# Patient Record
Sex: Female | Born: 1970 | Race: White | Hispanic: No | Marital: Married | Smoking: Never smoker
Health system: Southern US, Community
[De-identification: ages and names within clinical notes are randomized; demographics above are authoritative.]

## PROBLEM LIST (undated history)

## (undated) DIAGNOSIS — R0602 Shortness of breath: Secondary | ICD-10-CM

## (undated) HISTORY — PX: IRRIGATION AND DEBRIDEMENT HEMATOMA: SHX5254

## (undated) HISTORY — PX: COSMETIC SURGERY: SHX468

## (undated) HISTORY — PX: EYE SURGERY: SHX253

## (undated) HISTORY — DX: Shortness of breath: R06.02

---

## 1998-04-19 ENCOUNTER — Other Ambulatory Visit: Admission: RE | Admit: 1998-04-19 | Discharge: 1998-04-19 | Payer: Self-pay | Admitting: Obstetrics and Gynecology

## 1999-01-03 ENCOUNTER — Emergency Department (HOSPITAL_COMMUNITY): Admission: EM | Admit: 1999-01-03 | Discharge: 1999-01-04 | Payer: Self-pay

## 1999-07-25 ENCOUNTER — Other Ambulatory Visit: Admission: RE | Admit: 1999-07-25 | Discharge: 1999-07-25 | Payer: Self-pay | Admitting: Obstetrics and Gynecology

## 1999-08-08 HISTORY — PX: MANDIBLE SURGERY: SHX707

## 1999-10-03 ENCOUNTER — Encounter: Payer: Self-pay | Admitting: Oral and Maxillofacial Surgery

## 1999-10-05 ENCOUNTER — Inpatient Hospital Stay (HOSPITAL_COMMUNITY): Admission: RE | Admit: 1999-10-05 | Discharge: 1999-10-06 | Payer: Self-pay | Admitting: Oral and Maxillofacial Surgery

## 2000-01-11 ENCOUNTER — Other Ambulatory Visit: Admission: RE | Admit: 2000-01-11 | Discharge: 2000-01-11 | Payer: Self-pay | Admitting: Obstetrics and Gynecology

## 2000-10-12 ENCOUNTER — Other Ambulatory Visit: Admission: RE | Admit: 2000-10-12 | Discharge: 2000-10-12 | Payer: Self-pay | Admitting: Obstetrics and Gynecology

## 2002-03-27 ENCOUNTER — Other Ambulatory Visit: Admission: RE | Admit: 2002-03-27 | Discharge: 2002-03-27 | Payer: Self-pay | Admitting: Obstetrics and Gynecology

## 2002-08-07 HISTORY — PX: OTHER SURGICAL HISTORY: SHX169

## 2003-06-22 ENCOUNTER — Other Ambulatory Visit: Admission: RE | Admit: 2003-06-22 | Discharge: 2003-06-22 | Payer: Self-pay | Admitting: Obstetrics and Gynecology

## 2004-08-16 ENCOUNTER — Other Ambulatory Visit: Admission: RE | Admit: 2004-08-16 | Discharge: 2004-08-16 | Payer: Self-pay | Admitting: Obstetrics and Gynecology

## 2005-03-13 ENCOUNTER — Other Ambulatory Visit: Admission: RE | Admit: 2005-03-13 | Discharge: 2005-03-13 | Payer: Self-pay | Admitting: Obstetrics and Gynecology

## 2006-02-19 ENCOUNTER — Encounter: Admission: RE | Admit: 2006-02-19 | Discharge: 2006-02-19 | Payer: Self-pay | Admitting: Orthopedic Surgery

## 2006-02-22 ENCOUNTER — Ambulatory Visit (HOSPITAL_COMMUNITY): Admission: RE | Admit: 2006-02-22 | Discharge: 2006-02-22 | Payer: Self-pay | Admitting: Orthopedic Surgery

## 2007-07-28 ENCOUNTER — Emergency Department (HOSPITAL_COMMUNITY): Admission: EM | Admit: 2007-07-28 | Discharge: 2007-07-28 | Payer: Self-pay | Admitting: Emergency Medicine

## 2007-08-05 ENCOUNTER — Encounter: Admission: RE | Admit: 2007-08-05 | Discharge: 2007-08-05 | Payer: Self-pay | Admitting: Surgery

## 2007-11-22 ENCOUNTER — Encounter (INDEPENDENT_AMBULATORY_CARE_PROVIDER_SITE_OTHER): Payer: Self-pay | Admitting: Surgery

## 2007-11-22 ENCOUNTER — Ambulatory Visit (HOSPITAL_BASED_OUTPATIENT_CLINIC_OR_DEPARTMENT_OTHER): Admission: RE | Admit: 2007-11-22 | Discharge: 2007-11-22 | Payer: Self-pay | Admitting: Surgery

## 2008-08-10 LAB — HM MAMMOGRAPHY: HM Mammogram: NORMAL

## 2009-03-15 ENCOUNTER — Encounter: Admission: RE | Admit: 2009-03-15 | Discharge: 2009-03-15 | Payer: Self-pay | Admitting: Surgery

## 2009-03-16 ENCOUNTER — Encounter: Admission: RE | Admit: 2009-03-16 | Discharge: 2009-03-16 | Payer: Self-pay | Admitting: Surgery

## 2009-07-14 ENCOUNTER — Encounter: Admission: RE | Admit: 2009-07-14 | Discharge: 2009-07-14 | Payer: Self-pay | Admitting: Obstetrics and Gynecology

## 2010-01-24 ENCOUNTER — Ambulatory Visit: Payer: Self-pay | Admitting: Family Medicine

## 2010-01-24 DIAGNOSIS — R0602 Shortness of breath: Secondary | ICD-10-CM | POA: Insufficient documentation

## 2010-01-24 HISTORY — DX: Shortness of breath: R06.02

## 2010-01-25 ENCOUNTER — Ambulatory Visit: Payer: Self-pay | Admitting: Family Medicine

## 2010-01-31 ENCOUNTER — Telehealth: Payer: Self-pay | Admitting: Family Medicine

## 2010-03-17 ENCOUNTER — Ambulatory Visit: Payer: Self-pay | Admitting: Family Medicine

## 2010-03-17 LAB — CONVERTED CEMR LAB
AST: 21 units/L (ref 0–37)
Albumin: 4.2 g/dL (ref 3.5–5.2)
BUN: 14 mg/dL (ref 6–23)
Basophils Absolute: 0 10*3/uL (ref 0.0–0.1)
Bilirubin Urine: NEGATIVE
CO2: 27 meq/L (ref 19–32)
Chloride: 106 meq/L (ref 96–112)
Cholesterol: 158 mg/dL (ref 0–200)
GFR calc non Af Amer: 97.87 mL/min (ref >60)
Glucose, Bld: 93 mg/dL (ref 70–99)
Glucose, Urine, Semiquant: NEGATIVE
HCT: 38 % (ref 36.0–46.0)
Hemoglobin: 13 g/dL (ref 12.0–15.0)
Lymphs Abs: 2 10*3/uL (ref 0.7–4.0)
MCHC: 34.1 g/dL (ref 30.0–36.0)
MCV: 86.6 fL (ref 78.0–100.0)
Monocytes Absolute: 0.7 10*3/uL (ref 0.1–1.0)
Monocytes Relative: 11.7 % (ref 3.0–12.0)
Neutro Abs: 3 10*3/uL (ref 1.4–7.7)
Platelets: 338 10*3/uL (ref 150.0–400.0)
Potassium: 4.4 meq/L (ref 3.5–5.1)
Protein, U semiquant: NEGATIVE
RDW: 13.4 % (ref 11.5–14.6)
Sodium: 141 meq/L (ref 135–145)
Specific Gravity, Urine: 1.02
TSH: 1.46 microintl units/mL (ref 0.35–5.50)
Total Bilirubin: 0.4 mg/dL (ref 0.3–1.2)
VLDL: 18.4 mg/dL (ref 0.0–40.0)
WBC Urine, dipstick: NEGATIVE
pH: 5

## 2010-03-24 ENCOUNTER — Ambulatory Visit: Payer: Self-pay | Admitting: Family Medicine

## 2010-07-15 ENCOUNTER — Encounter
Admission: RE | Admit: 2010-07-15 | Discharge: 2010-07-15 | Payer: Self-pay | Source: Home / Self Care | Attending: Obstetrics and Gynecology | Admitting: Obstetrics and Gynecology

## 2010-08-29 ENCOUNTER — Encounter: Payer: Self-pay | Admitting: Surgery

## 2010-09-04 LAB — CONVERTED CEMR LAB: Pap Smear: ABNORMAL

## 2010-09-08 NOTE — Assessment & Plan Note (Signed)
Summary: cpx//ccm   Vital Signs:  Patient profile:   40 year old female Menstrual status:  regular Height:      67.75 inches Weight:      168 pounds Temp:     98.2 degrees F oral BP sitting:   110 / 80  (left arm) Cuff size:   regular  Vitals Entered By: Sid Falcon LPN (March 24, 2010 9:02 AM) CC: CPX   History of Present Illness: Here for CPE.    No signif chronic medical problems. Pt sees GYN and DERM yearly. Exercises regularly.  Last tetanus unknown.  Clinical Review Panels:  Prevention   Last Mammogram:  normal (05/07/2009)   Last Pap Smear:  abnormal (05/07/2009)  Lipid Management   Cholesterol:  158 (03/17/2010)   LDL (bad choesterol):  81 (03/17/2010)   HDL (good cholesterol):  58.60 (03/17/2010)  CBC   WBC:  5.8 (03/17/2010)   RBC:  4.39 (03/17/2010)   Hgb:  13.0 (03/17/2010)   Hct:  38.0 (03/17/2010)   Platelets:  338.0 (03/17/2010)   MCV  86.6 (03/17/2010)   MCHC  34.1 (03/17/2010)   RDW  13.4 (03/17/2010)   PMN:  51.5 (03/17/2010)   Lymphs:  33.7 (03/17/2010)   Monos:  11.7 (03/17/2010)   Eosinophils:  2.4 (03/17/2010)   Basophil:  0.7 (03/17/2010)  Complete Metabolic Panel   Glucose:  93 (03/17/2010)   Sodium:  141 (03/17/2010)   Potassium:  4.4 (03/17/2010)   Chloride:  106 (03/17/2010)   CO2:  27 (03/17/2010)   BUN:  14 (03/17/2010)   Creatinine:  0.7 (03/17/2010)   Albumin:  4.2 (03/17/2010)   Total Protein:  6.7 (03/17/2010)   Calcium:  9.4 (03/17/2010)   Total Bili:  0.4 (03/17/2010)   Alk Phos:  61 (03/17/2010)   SGPT (ALT):  11 (03/17/2010)   SGOT (AST):  21 (03/17/2010)   Allergies (verified): No Known Drug Allergies  Past History:  Family History: Last updated: 03/24/2010 Family History of Alcoholism/Addiction, father Mother Type 2 DM  Social History: Last updated: 01/24/2010 Occupation:  Student Married Never Smoked Alcohol use-no Regular exercise-yes 2 pregnancies 2 live births  Risk Factors: Exercise:  yes (01/24/2010)  Risk Factors: Smoking Status: never (01/24/2010)  Past Medical History: no chronic problems  Past Surgical History: Caesarean section, 1993, 1997 Jaw surgery 2001 abdomnaplasty 2004 Abdominal wall ?infected hematoma surgery 2008  Family History: Family History of Alcoholism/Addiction, father Mother Type 2 DM  Review of Systems  The patient denies anorexia, fever, weight loss, weight gain, vision loss, decreased hearing, hoarseness, chest pain, syncope, dyspnea on exertion, peripheral edema, prolonged cough, headaches, hemoptysis, abdominal pain, melena, hematochezia, severe indigestion/heartburn, hematuria, incontinence, genital sores, muscle weakness, suspicious skin lesions, transient blindness, difficulty walking, depression, unusual weight change, abnormal bleeding, enlarged lymph nodes, and breast masses.    Physical Exam  General:  Well-developed,well-nourished,in no acute distress; alert,appropriate and cooperative throughout examination Head:  Normocephalic and atraumatic without obvious abnormalities. No apparent alopecia or balding. Eyes:  No corneal or conjunctival inflammation noted. EOMI. Perrla. Funduscopic exam benign, without hemorrhages, exudates or papilledema. Vision grossly normal. Ears:  External ear exam shows no significant lesions or deformities.  Otoscopic examination reveals clear canals, tympanic membranes are intact bilaterally without bulging, retraction, inflammation or discharge. Hearing is grossly normal bilaterally. Mouth:  Oral mucosa and oropharynx without lesions or exudates.  Teeth in good repair. Neck:  No deformities, masses, or tenderness noted. Breasts:  gyn Lungs:  Normal respiratory effort, chest expands symmetrically.  Lungs are clear to auscultation, no crackles or wheezes. Heart:  Normal rate and regular rhythm. S1 and S2 normal without gallop, murmur, click, rub or other extra sounds. Abdomen:  soft and non-tender.     Genitalia:  gyn Msk:  No deformity or scoliosis noted of thoracic or lumbar spine.   Extremities:  No clubbing, cyanosis, edema, or deformity noted with normal full range of motion of all joints.   Neurologic:  alert & oriented X3 and cranial nerves II-XII intact.   Skin:  no rashes and no suspicious lesions.   Cervical Nodes:  No lymphadenopathy noted Psych:  normally interactive, good eye contact, not anxious appearing, and not depressed appearing.     Impression & Recommendations:  Problem # 1:  Preventive Health Care (ICD-V70.0) Labs reviewed with pt and all excellent.  Tdap given. Cont regular exercise.  Complete Medication List: 1)  Womens Multivitamin Plus Tabs (Multiple vitamins-minerals) .... Once daily 2)  Fish Oil 1000 Mg Caps (Omega-3 fatty acids) .... Once daily 3)  Ventolin Hfa 108 (90 Base) Mcg/act Aers (Albuterol sulfate) .... 2 puffs 30 minutes prior to exercise  Other Orders: Tdap => 39yrs IM (14782) Admin 1st Vaccine (95621)  Patient Instructions: 1)  Contnue regular exercise. 2)  Please schedule a follow-up appointment in 1 year.  3)  Take calcium +vitamin D daily.     Immunizations Administered:  Tetanus Vaccine:    Vaccine Type: Tdap    Site: left deltoid    Mfr: GlaxoSmithKline    Dose: 0.5 ml    Route: IM    Given by: Sid Falcon LPN    Exp. Date: 05/26/2012    Lot #: [id number]    VIS given: 06/25/07 version given March 24, 2010.

## 2010-09-08 NOTE — Assessment & Plan Note (Signed)
Summary: NEW PT EST // RS   Vital Signs:  Patient profile:   40 year old female Menstrual status:  regular LMP:     01/11/2010 Height:      67.5 inches Weight:      169 pounds BMI:     26.17 O2 Sat:      97 % on Room air Temp:     98.4 degrees F oral Pulse rate:   72 / minute Pulse rhythm:   regular Resp:     12 per minute BP sitting:   134 / 84  (left arm) Cuff size:   regular  Vitals Entered By: Sid Falcon LPN (January 24, 2010 3:04 PM)  O2 Flow:  Room air CC: New to establish LMP (date): 01/11/2010     Menstrual Status regular Enter LMP: 01/11/2010 Last PAP Result abnormal   History of Present Illness: New patient to establish care. Past medical history reviewed. Basically unremarkable past history. Takes no medications. No known drug allergies.  Family history and social history reviewed and as recorded.  Patient has recent issue over several months now of some shortness of breath. This occurs sometimes at rest and sometimes with activity. She notices for example that she has mild dyspnea at onset of exercise and usually this actually improves with exercise. Has occasional tightness in her chest which also improves with further exercise. She denies any GERD symptoms, wheezing, cough, pleuritic pain, or hemoptysis. No fevers, chills, appetite, or weight changes. She is nonsmoker.  No hx of asthma.  No definite palpitations. Exercises regularly with fairly intensive exercise. No correlation with symptoms and intensive exercise. Drinks 5-6 cups of coffee per day. Father may have had coronary disease in his 53s otherwise no family history.  Preventive Screening-Counseling & Management  Alcohol-Tobacco     Smoking Status: never  Caffeine-Diet-Exercise     Does Patient Exercise: yes  Past History:  Family History: Last updated: 01/24/2010 Family History of Alcoholism/Addiction, father  Social History: Last updated: 01/24/2010 Occupation:  Student Married Never  Smoked Alcohol use-no Regular exercise-yes 2 pregnancies 2 live births  Risk Factors: Exercise: yes (01/24/2010)  Risk Factors: Smoking Status: never (01/24/2010)  Past Surgical History: Caesarean section, 1993, 1997 Jaw surgery 2001 abdomnaplasty 2004 Stomach surgery 2008 PMH-FH-SH reviewed for relevance  Family History: Family History of Alcoholism/Addiction, father  Social History: Occupation:  Consulting civil engineer Married Never Smoked Alcohol use-no Regular exercise-yes 2 pregnancies 2 live births Smoking Status:  never Occupation:  employed Does Patient Exercise:  yes  Review of Systems  The patient denies anorexia, fever, weight loss, weight gain, syncope, peripheral edema, prolonged cough, headaches, hemoptysis, abdominal pain, melena, hematochezia, severe indigestion/heartburn, hematuria, incontinence, and muscle weakness.    Physical Exam  General:  Well-developed,well-nourished,in no acute distress; alert,appropriate and cooperative throughout examination Head:  Normocephalic and atraumatic without obvious abnormalities. No apparent alopecia or balding. Mouth:  Oral mucosa and oropharynx without lesions or exudates.  Teeth in good repair. Neck:  No deformities, masses, or tenderness noted. Lungs:  Normal respiratory effort, chest expands symmetrically. Lungs are clear to auscultation, no crackles or wheezes. Heart:  Normal rate and regular rhythm. S1 and S2 normal without gallop, murmur, click, rub or other extra sounds. Extremities:  no edema Cervical Nodes:  No lymphadenopathy noted Psych:  normally interactive, good eye contact, not anxious appearing, and not depressed appearing.     Impression & Recommendations:  Problem # 1:  DYSPNEA (ICD-786.05) Assessment New Doubt cardiac pathology as symptoms improve with  duration of exercise.  EKG is NSR with no acute changes.  Check CXR.  Reduce caffeine. Orders: EKG w/ Interpretation (93000) T-2 View CXR  (71020TC)  Complete Medication List: 1)  Womens Multivitamin Plus Tabs (Multiple vitamins-minerals) .... Once daily 2)  Fish Oil 1000 Mg Caps (Omega-3 fatty acids) .... Once daily  Patient Instructions: 1)  Gradually reduce caffeine consumption 2)  Follow up immediately if you have any exercise induced chest pain or shortness of breath  Preventive Care Screening  Mammogram:    Date:  05/07/2009    Results:  normal   Pap Smear:    Date:  05/07/2009    Results:  abnormal

## 2010-09-08 NOTE — Progress Notes (Signed)
Summary: wheezing with exercise  Phone Note Call from Patient Call back at [facility] Phone [phone]   Summary of Call: Today with exercising got the wheezing, chest pain/tightness that she was able to get through.  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  [facility]  NKDA. Initial call taken by: Rudy Jew, RN,  January 31, 2010 9:09 AM  Follow-up for Phone Call        Ventolin inhaler 2 puffs 30 minutes prior to exercise.  Refill times three. Follow-up by: Evelena Peat MD,  January 31, 2010 12:46 PM  Additional Follow-up for Phone Call Additional follow up Details #1::        Phone Call Completed Additional Follow-up by: Rudy Jew, RN,  January 31, 2010 1:39 PM    New/Updated Medications: VENTOLIN HFA 108 (90 BASE) MCG/ACT AERS (ALBUTEROL SULFATE) 2 puffs 30 minutes prior to exercise Prescriptions: VENTOLIN HFA 108 (90 BASE) MCG/ACT AERS (ALBUTEROL SULFATE) 2 puffs 30 minutes prior to exercise  #1 x 3   Entered by:   Rudy Jew, RN   Authorized by:   Evelena Peat MD   Signed by:   Rudy Jew, RN on 01/31/2010   Method used:   Electronically to        [facility]* (retail)       [address] [place], [place]  [address]       Ph: [phone] or [phone]       Fax: [phone]   RxID:   [id number]

## 2010-10-03 ENCOUNTER — Other Ambulatory Visit: Payer: Self-pay | Admitting: *Deleted

## 2010-10-03 DIAGNOSIS — R062 Wheezing: Secondary | ICD-10-CM

## 2010-10-03 MED ORDER — ALBUTEROL SULFATE HFA 108 (90 BASE) MCG/ACT IN AERS: 2.0000 | INHALATION_SPRAY | Freq: Four times a day (QID) | RESPIRATORY_TRACT | Status: DC | PRN

## 2010-10-11 ENCOUNTER — Telehealth: Payer: Self-pay | Admitting: Family Medicine

## 2010-10-11 DIAGNOSIS — R062 Wheezing: Secondary | ICD-10-CM

## 2010-10-11 MED ORDER — ALBUTEROL SULFATE HFA 108 (90 BASE) MCG/ACT IN AERS: 2.0000 | INHALATION_SPRAY | Freq: Four times a day (QID) | RESPIRATORY_TRACT | Status: DC | PRN

## 2010-10-11 NOTE — Telephone Encounter (Signed)
Pt called and said that Karin Golden has faxed over numerous req for Ventolin for her Asthma. Have not rcvd a response. Pls call in asap to [facility] [phone]

## 2010-10-11 NOTE — Telephone Encounter (Signed)
Rx sent electronically again, pt informed

## 2010-12-20 NOTE — Op Note (Signed)
NAME:  Velez, Debbie                ACCOUNT NO.:  [id number]   MEDICAL RECORD NO.:  [id number]          PATIENT TYPE:  AMB   LOCATION:  DSC                          FACILITY:  [facility]   PHYSICIAN:  Wilmon Arms. Corliss Skains, M.D. DATE OF BIRTH:  06/16/1971   DATE OF PROCEDURE:  11/22/2007  DATE OF DISCHARGE:                               OPERATIVE REPORT   PREOPERATIVE DIAGNOSIS:  Chronic abdominal wound, left lower abdomen.   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   PROCEDURE:  Wound exploration with excision of suture granuloma, 1 cm.   SURGEON:  Wilmon Arms. Corliss Skains, MD   ANESTHESIA:  General.   INDICATIONS:  The patient is a 40 year old female with a history of an  abdominoplasty in 2004.  The patient has had the persistent area of firm  scar tissue on her left side just below the level of her umbilicus.  This has caused intermittent pulling and pain.  This has progressed to a  chronic wound with some purulent drainage.  She presents for exploration  of this wound.   DESCRIPTION OF PROCEDURE:  The patient was brought to the operating room  and placed in supine position on the operating room table.  After an  adequate level of general anesthesia was obtained, the patient's abdomen  was prepped with Betadine and draped in sterile fashion.  A time-out was  taken.  Ensured the proper patient and proper procedure.  We infiltrated  the area around the chronic wound with 0.25% Marcaine with epinephrine.  I made an elliptical incision excising the old granulation tissue.  Dissection was carried down in the subcutaneous tissues with a blade.  Cautery was used to excise some of the old granulation tissue.  A  Prolene suture was identified and grasped with hemostat.  We continued  dissecting down around the suture.  There was some old chronic necrotic  granulation tissue, which was removed.  We were able locate the knot of  the Prolene.  This was  cut and removed.  We debrided as much granulation  tissue as possible.  The bed of the wound was cauterized for hemostasis.  The wound was packed with saline-soaked half inch new gauze.  A dry  dressing was placed.  The patient was extubated and brought to the  recovery room in stable condition.  All sponge, instrument, and needle  counts were correct.      Wilmon Arms. Tsuei, M.D.  Electronically Signed     MKT/MEDQ  D:  11/22/2007  T:  11/22/2007  Job:  [id number]

## 2010-12-23 NOTE — Op Note (Signed)
NAME:  Debbie Velez, Debbie Velez                ACCOUNT NO.:  [id number]   MEDICAL RECORD NO.:  [id number]          PATIENT TYPE:  AMB   LOCATION:  SDS                          FACILITY:  [facility]   PHYSICIAN:  Dionne Ano. Gramig III, M.D.DATE OF BIRTH:  16-Sep-1970   DATE OF PROCEDURE:  DATE OF DISCHARGE:  02/22/2006                                 OPERATIVE REPORT   PREOPERATIVE DIAGNOSIS:  Comminuted intraarticular proximal interphalangeal  joint fracture, right ring finger.   POSTOPERATIVE DIAGNOSIS:  Comminuted intraarticular proximal interphalangeal  joint fracture, right ring finger.   PROCEDURE:  1.  External fixation with dynamic external fixator device, right ring      finger.  2.  Open reduction, internal fixation, comminuted complex intraarticular      middle phalanx fracture at the proximal interphalangeal joint, of the      right ring finger.  3.  Stress radiography.   SURGEON:  Dionne Ano. Amanda Pea, MD   ASSISTANT:  ___________________.   COMPLICATIONS:  None.   ANESTHESIA:  General.   TOURNIQUET TIME:  Less than an hour.   INDICATIONS FOR PROCEDURE:  Debbie Velez is a very pleasant female who is 40  years of age and presents with comminuted intraarticular PIP fracture about  her left ring finger.  She has deviatory deformity and a pilon type crush.  I have counseled her in regards risks and benefits of surgery and the above-  mentioned operative procedure.   OPERATIVE PROCEDURE IN DETAIL:  The patient was given anesthesia, taken to  the operating suite and had general anesthesia.  Once this was done, she was  lay supine, thoroughly padded and underwent surgical Betadine scrub and  paint upper extremity.  Once this was done, the arm was elevated.  Tourniquet was inflated to 250 mmHg, and an external fixer apparatus was  assembled.  A 0.045 K-wire was placed through the epicenter of the proximal  phalanx distal condyle under LAT fluoro.  Following this, a similar K-wire  was  placed about the middle phalanx distally.  These K-wires were then bent  just off the skin surface at 90 degrees.  (The proximal pin was bent 90  degrees.)  Following this, bent at 30-60 degrees, the proximal wire once  again just 8 mm distal to the distal pin.  Once this was done, I then  assembled the pins and bent them at right angles to allow for distraction of  the PIP joint.  They dynamic external fixator was placed without difficulty  utilizing this.  At this point in time, I was pleased with the distraction  and findings.  The traction fixation device looked excellent.  Once this was  done, I then turned attention towards the open reduction portion of the  articular area.  A small incision was made dorsally.  A 0.062 K-wire was  placed about the mid portion of the middle phalanx to open up a pilot hole  just dorsally in the cortex.  I then threaded a blunt-tipped 0.045 K-wire  and punched out the articular surface until the articular congruity was  restored  to my satisfaction without difficulty.  The articular congruity  looked excellent.  I manipulated this and performed the open reduction  without difficulty.  Once this was done, I then closed the interval, and  closed the skin with Prolene after tourniquet was deflated.  Once this was  done, final copy of x-rays were taken.  Full range of motion was noted on  the table with flexion and extension passively.  X-rays were saved for  followup, and I discuss these and showed these to the patient's family.  Once this was done, she was dressed with pin caps, followed by Xeroform.  Intermetacarpal block was placed with lidocaine 0.25% without epinephrine,  and the patient then underwent placement of a splint.  She tolerated the  procedure well.  She will be given an additional g of Ancef in the recovery  room and be discharged home on Percocet, Robaxin, Keflex.  Return to see me  in the office Monday at 2 p.m. to initiate therapeutic  endeavors.  All  questions have been encouraged and answered.           ______________________________  Dionne Ano. Everlene Other, M.D.     Luther Mantis  D:  02/22/2006  T:  02/23/2006  Job:  [id number]

## 2011-01-09 ENCOUNTER — Encounter: Payer: Self-pay | Admitting: Family Medicine

## 2011-01-09 ENCOUNTER — Ambulatory Visit (INDEPENDENT_AMBULATORY_CARE_PROVIDER_SITE_OTHER): Payer: Self-pay | Admitting: Family Medicine

## 2011-01-09 VITALS — BP 140/84 | Temp 98.0°F | Ht 67.5 in | Wt 170.0 lb

## 2011-01-09 DIAGNOSIS — R21 Rash and other nonspecific skin eruption: Secondary | ICD-10-CM

## 2011-01-09 NOTE — Progress Notes (Signed)
  Subjective:    Patient ID: Debbie Velez, female    DOB: 1971-03-12, 40 y.o.   MRN: [id number]  HPI Patient seen with question of spider bite left posterior thigh. She noticed last Thursday question of bite. Had itching since then and no pain. No pustular center. No fever or chills. This has not decreased in size. Has not tried anything topical other than hydrocortisone cream.  Has history of acne involving trunk.     Review of Systems  Constitutional: Negative for fever, chills and fatigue.  Hematological: Negative for adenopathy. Does not bruise/bleed easily.       Objective:   Physical Exam  Constitutional: She appears well-developed and well-nourished.  Cardiovascular: Normal rate, regular rhythm and normal heart sounds.   Pulmonary/Chest: Effort normal and breath sounds normal. No respiratory distress. She has no wheezes. She has no rales.  Musculoskeletal: She exhibits no edema.  Skin:       Patient has area of erythema slightly raised nontender non-warm to touch with no vesicles and no pustules left posterior thigh. The area of erythema is approximately 2 x 2 centimeters.          Assessment & Plan:  #1 local allergic rash probably related to bite reaction. Reassurance. Over-the-counter antihistamine #2 acne. Continue clindamycin lotion. Called pharmacy and they have some in stock

## 2011-01-09 NOTE — Patient Instructions (Signed)
Try over-the-counter antihistamine such as Allegra, Claritin, or Zyrtec for the itching. Area of redness should fade over the next week

## 2011-05-02 LAB — POCT HEMOGLOBIN-HEMACUE: Hemoglobin: 13.7

## 2011-05-12 LAB — URINALYSIS, ROUTINE W REFLEX MICROSCOPIC
Bilirubin Urine: NEGATIVE
Nitrite: NEGATIVE
Specific Gravity, Urine: 1.009
pH: 5.5

## 2011-05-12 LAB — URINE MICROSCOPIC-ADD ON

## 2011-05-12 LAB — POCT PREGNANCY, URINE: Operator id: 284251

## 2011-06-19 ENCOUNTER — Other Ambulatory Visit: Payer: Self-pay | Admitting: Obstetrics and Gynecology

## 2011-06-19 DIAGNOSIS — Z1231 Encounter for screening mammogram for malignant neoplasm of breast: Secondary | ICD-10-CM

## 2011-07-06 ENCOUNTER — Ambulatory Visit (INDEPENDENT_AMBULATORY_CARE_PROVIDER_SITE_OTHER): Payer: BC Managed Care – PPO | Admitting: Family Medicine

## 2011-07-06 ENCOUNTER — Encounter: Payer: Self-pay | Admitting: Family Medicine

## 2011-07-06 VITALS — BP 140/88 | Temp 98.6°F | Wt 168.0 lb

## 2011-07-06 DIAGNOSIS — R05 Cough: Secondary | ICD-10-CM

## 2011-07-06 MED ORDER — HYDROCODONE-HOMATROPINE 5-1.5 MG/5ML PO SYRP: 5.0000 mL | ORAL_SOLUTION | Freq: Four times a day (QID) | ORAL | Status: AC | PRN

## 2011-07-06 NOTE — Progress Notes (Addendum)
  Subjective:    Patient ID: Debbie Velez, female    DOB: 19-May-1971, 40 y.o.   MRN: [id number]  HPI Acute visit. Patient seen with five-day history of cough, nasal congestion, intermittent headaches, body aches, malaise, and chills. No definite fever. Taking Mucinex D and Zyrtec with some relief of congestion. Still coughing significantly at night. No wheezing. Husband with similar symptoms. Denies any nausea, vomiting, or diarrhea.   Review of Systems As per history of present illness    Objective:   Physical Exam  Constitutional: She appears well-developed and well-nourished. No distress.  HENT:  Right Ear: External ear normal.  Left Ear: External ear normal.       Previous tonsillectomy. Minimal posterior pharynx erythema. No exudate  Neck: Neck supple.  Cardiovascular: Normal rate and regular rhythm.   Pulmonary/Chest: Effort normal and breath sounds normal. No respiratory distress. She has no wheezes. She has no rales.  Lymphadenopathy:    She has no cervical adenopathy.  Skin: No rash noted.          Assessment & Plan:  Viral syndrome. Hycodan for nighttime cough. Followup promptly for fever or worsening symptoms  Spoke with patient.  Was feeling slightly better and now has increased facial pain, colored nasal discharge and some headaches.  No stiff neck .  Will send in rx for antibiotic with Z-max.  Pt has no drug allergies.

## 2011-07-06 NOTE — Patient Instructions (Signed)
Call for any fever or worsening symptoms.

## 2011-07-08 MED ORDER — AZITHROMYCIN 250 MG PO TABS: ORAL_TABLET | ORAL | Status: AC

## 2011-07-08 NOTE — Progress Notes (Signed)
Addended by: Kristian Covey on: 07/08/2011 10:28 AM   Modules accepted: Orders

## 2011-07-10 ENCOUNTER — Encounter: Payer: BC Managed Care – PPO | Admitting: Family Medicine

## 2011-07-20 ENCOUNTER — Other Ambulatory Visit: Payer: Self-pay | Admitting: Family Medicine

## 2011-07-20 NOTE — Telephone Encounter (Signed)
Last filled 07-06-2011

## 2011-07-20 NOTE — Telephone Encounter (Signed)
Refill once OK. 

## 2011-07-26 ENCOUNTER — Ambulatory Visit
Admission: RE | Admit: 2011-07-26 | Discharge: 2011-07-26 | Disposition: A | Discharge status: Home / Self Care | Payer: BC Managed Care – PPO | Source: Ambulatory Visit | Attending: Obstetrics and Gynecology | Admitting: Obstetrics and Gynecology

## 2011-07-26 DIAGNOSIS — Z1231 Encounter for screening mammogram for malignant neoplasm of breast: Secondary | ICD-10-CM

## 2011-09-27 ENCOUNTER — Other Ambulatory Visit: Payer: Self-pay | Admitting: Dermatology

## 2011-10-23 ENCOUNTER — Other Ambulatory Visit: Payer: Self-pay | Admitting: *Deleted

## 2012-06-19 ENCOUNTER — Other Ambulatory Visit: Payer: Self-pay | Admitting: Obstetrics and Gynecology

## 2012-06-19 DIAGNOSIS — Z1231 Encounter for screening mammogram for malignant neoplasm of breast: Secondary | ICD-10-CM

## 2012-08-08 ENCOUNTER — Ambulatory Visit: Payer: BC Managed Care – PPO

## 2012-09-06 ENCOUNTER — Ambulatory Visit: Payer: BC Managed Care – PPO | Admitting: Family Medicine

## 2012-09-25 ENCOUNTER — Ambulatory Visit
Admission: RE | Admit: 2012-09-25 | Discharge: 2012-09-25 | Disposition: A | Discharge status: Home / Self Care | Payer: BC Managed Care – PPO | Source: Ambulatory Visit | Attending: Obstetrics and Gynecology | Admitting: Obstetrics and Gynecology

## 2012-09-25 DIAGNOSIS — Z1231 Encounter for screening mammogram for malignant neoplasm of breast: Secondary | ICD-10-CM

## 2012-09-26 ENCOUNTER — Other Ambulatory Visit: Payer: Self-pay | Admitting: Obstetrics and Gynecology

## 2012-09-30 ENCOUNTER — Ambulatory Visit
Admission: RE | Admit: 2012-09-30 | Discharge: 2012-09-30 | Disposition: A | Discharge status: Home / Self Care | Payer: BC Managed Care – PPO | Source: Ambulatory Visit | Attending: Obstetrics and Gynecology | Admitting: Obstetrics and Gynecology

## 2012-09-30 DIAGNOSIS — R928 Other abnormal and inconclusive findings on diagnostic imaging of breast: Secondary | ICD-10-CM

## 2012-10-07 ENCOUNTER — Other Ambulatory Visit: Payer: BC Managed Care – PPO

## 2013-02-14 ENCOUNTER — Encounter: Payer: BC Managed Care – PPO | Admitting: Family Medicine

## 2013-03-03 ENCOUNTER — Encounter: Payer: Self-pay | Admitting: Family Medicine

## 2013-03-03 ENCOUNTER — Telehealth: Payer: Self-pay | Admitting: Family Medicine

## 2013-03-03 ENCOUNTER — Ambulatory Visit (INDEPENDENT_AMBULATORY_CARE_PROVIDER_SITE_OTHER): Payer: BC Managed Care – PPO | Admitting: Family Medicine

## 2013-03-03 VITALS — BP 126/78 | HR 68 | Temp 97.7°F | Ht 67.5 in | Wt 161.0 lb

## 2013-03-03 DIAGNOSIS — Z0000 Encounter for general adult medical examination without abnormal findings: Secondary | ICD-10-CM

## 2013-03-03 DIAGNOSIS — Z01419 Encounter for gynecological examination (general) (routine) without abnormal findings: Secondary | ICD-10-CM

## 2013-03-03 LAB — POCT URINALYSIS DIPSTICK
Glucose, UA: NEGATIVE
Nitrite, UA: NEGATIVE
Urobilinogen, UA: 0.2

## 2013-03-03 LAB — BASIC METABOLIC PANEL
BUN: 11 mg/dL (ref 6–23)
Calcium: 9.5 mg/dL (ref 8.4–10.5)
Creatinine, Ser: 0.7 mg/dL (ref 0.4–1.2)
GFR: 91.94 mL/min (ref >60.00)
Potassium: 4.5 mEq/L (ref 3.5–5.1)

## 2013-03-03 LAB — CBC WITH DIFFERENTIAL/PLATELET
Basophils Relative: 0.4 % (ref 0.0–3.0)
Eosinophils Absolute: 0.1 10*3/uL (ref 0.0–0.7)
Eosinophils Relative: 1.3 % (ref 0.0–5.0)
HCT: 43 % (ref 36.0–46.0)
Hemoglobin: 14.6 g/dL (ref 12.0–15.0)
MCHC: 34 g/dL (ref 30.0–36.0)
MCV: 89.2 fl (ref 78.0–100.0)
Monocytes Absolute: 0.7 10*3/uL (ref 0.1–1.0)
Neutro Abs: 4.2 10*3/uL (ref 1.4–7.7)
RBC: 4.82 Mil/uL (ref 3.87–5.11)

## 2013-03-03 LAB — LIPID PANEL: Cholesterol: 165 mg/dL (ref 0–200)

## 2013-03-03 LAB — HEPATIC FUNCTION PANEL
Albumin: 4.5 g/dL (ref 3.5–5.2)
Total Protein: 7.6 g/dL (ref 6.0–8.3)

## 2013-03-03 NOTE — Telephone Encounter (Signed)
Once labs come in, please MAIL a copy to patient, pt requested this upon her leaving from getting labs drawn. Thank you.

## 2013-03-03 NOTE — Progress Notes (Signed)
  Subjective:    Patient ID: Debbie Velez, female    DOB: 02/04/1971, 42 y.o.   MRN: [id number]  HPI Patient here for complete physical. She sees gynecologist dermatologist regularly. Generally very healthy with no chronic medical problems. She started consistent exercise program and dietary changes and has lost about 14-15 pounds over the past couple months. She feels great overall. Last tetanus 2011. She is getting mammograms and Pap smears through gynecologist. No indications for colonoscopy prior to 48  Past Medical History  Diagnosis Date  . DYSPNEA 01/24/2010   Past Surgical History  Procedure Laterality Date  . Cesarean section      1993, 1997  . Mandible surgery  2001  . Abdomnaplasty  2004  . Irrigation and debridement hematoma      infected abdominal wall surgery 2008    reports that she has never smoked. She does not have any smokeless tobacco history on file. Her alcohol and drug histories are not on file. family history includes Alcohol abuse in her father and sister and Diabetes in her mother. No Known Allergies    Review of Systems  Constitutional: Negative for fever, activity change, appetite change, fatigue and unexpected weight change.  HENT: Negative for hearing loss, ear pain, sore throat and trouble swallowing.   Eyes: Negative for visual disturbance.  Respiratory: Negative for cough and shortness of breath.   Cardiovascular: Negative for chest pain and palpitations.  Gastrointestinal: Negative for abdominal pain, diarrhea, constipation and blood in stool.  Genitourinary: Negative for dysuria and hematuria.  Musculoskeletal: Negative for myalgias, back pain and arthralgias.  Skin: Negative for rash.  Neurological: Negative for dizziness, syncope and headaches.  Hematological: Negative for adenopathy.  Psychiatric/Behavioral: Negative for confusion and dysphoric mood.       Objective:   Physical Exam  Constitutional: She is oriented to person, place,  and time. She appears well-developed and well-nourished.  HENT:  Head: Normocephalic and atraumatic.  Eyes: EOM are normal. Pupils are equal, round, and reactive to light.  Neck: Normal range of motion. Neck supple. No thyromegaly present.  Cardiovascular: Normal rate, regular rhythm and normal heart sounds.   No murmur heard. Pulmonary/Chest: Breath sounds normal. No respiratory distress. She has no wheezes. She has no rales.  Abdominal: Soft. Bowel sounds are normal. She exhibits no distension and no mass. There is no tenderness. There is no rebound and no guarding.  Musculoskeletal: Normal range of motion. She exhibits no edema.  Lymphadenopathy:    She has no cervical adenopathy.  Neurological: She is alert and oriented to person, place, and time. She displays normal reflexes. No cranial nerve deficit.  Skin: No rash noted.  Psychiatric: She has a normal mood and affect. Her behavior is normal. Judgment and thought content normal.          Assessment & Plan:  Healthy 42 year old female. Obtain screening lab work. Continue regular exercise habits. She'll continue regular GYN followup

## 2013-03-04 NOTE — Telephone Encounter (Signed)
Mailed patient labs

## 2013-06-12 ENCOUNTER — Other Ambulatory Visit: Payer: Self-pay

## 2013-09-09 ENCOUNTER — Other Ambulatory Visit: Payer: Self-pay

## 2013-09-09 DIAGNOSIS — Z1231 Encounter for screening mammogram for malignant neoplasm of breast: Secondary | ICD-10-CM

## 2013-09-26 ENCOUNTER — Ambulatory Visit
Admission: RE | Admit: 2013-09-26 | Discharge: 2013-09-26 | Disposition: A | Discharge status: Home / Self Care | Payer: BC Managed Care – PPO | Source: Ambulatory Visit

## 2013-09-26 DIAGNOSIS — Z1231 Encounter for screening mammogram for malignant neoplasm of breast: Secondary | ICD-10-CM

## 2014-04-10 ENCOUNTER — Other Ambulatory Visit: Payer: BC Managed Care – PPO

## 2014-04-17 ENCOUNTER — Encounter: Payer: BC Managed Care – PPO | Admitting: Family Medicine

## 2014-09-07 ENCOUNTER — Other Ambulatory Visit: Payer: Self-pay | Admitting: Obstetrics and Gynecology

## 2014-09-07 ENCOUNTER — Other Ambulatory Visit: Payer: Self-pay

## 2014-09-07 DIAGNOSIS — Z1231 Encounter for screening mammogram for malignant neoplasm of breast: Secondary | ICD-10-CM

## 2014-09-29 ENCOUNTER — Ambulatory Visit: Payer: Self-pay

## 2014-09-29 ENCOUNTER — Ambulatory Visit
Admission: RE | Admit: 2014-09-29 | Discharge: 2014-09-29 | Disposition: A | Discharge status: Home / Self Care | Payer: BLUE CROSS/BLUE SHIELD | Source: Ambulatory Visit

## 2014-09-29 DIAGNOSIS — Z1231 Encounter for screening mammogram for malignant neoplasm of breast: Secondary | ICD-10-CM

## 2015-03-12 ENCOUNTER — Other Ambulatory Visit: Payer: BLUE CROSS/BLUE SHIELD

## 2015-03-19 ENCOUNTER — Encounter: Payer: BLUE CROSS/BLUE SHIELD | Admitting: Family Medicine

## 2015-04-03 ENCOUNTER — Encounter: Payer: Self-pay | Admitting: Family Medicine

## 2015-04-07 ENCOUNTER — Other Ambulatory Visit: Payer: BLUE CROSS/BLUE SHIELD

## 2015-04-14 ENCOUNTER — Encounter: Payer: BLUE CROSS/BLUE SHIELD | Admitting: Family Medicine

## 2015-04-16 ENCOUNTER — Other Ambulatory Visit (INDEPENDENT_AMBULATORY_CARE_PROVIDER_SITE_OTHER): Payer: BLUE CROSS/BLUE SHIELD

## 2015-04-16 DIAGNOSIS — Z0000 Encounter for general adult medical examination without abnormal findings: Secondary | ICD-10-CM | POA: Diagnosis not present

## 2015-04-16 LAB — CBC WITH DIFFERENTIAL/PLATELET
BASOS PCT: 0.6 % (ref 0.0–3.0)
Basophils Absolute: 0 10*3/uL (ref 0.0–0.1)
EOS PCT: 2.4 % (ref 0.0–5.0)
Eosinophils Absolute: 0.2 10*3/uL (ref 0.0–0.7)
HEMATOCRIT: 43.2 % (ref 36.0–46.0)
HEMOGLOBIN: 14.5 g/dL (ref 12.0–15.0)
LYMPHS PCT: 32.4 % (ref 12.0–46.0)
Lymphs Abs: 2.1 10*3/uL (ref 0.7–4.0)
MCHC: 33.6 g/dL (ref 30.0–36.0)
MCV: 88.7 fl (ref 78.0–100.0)
MONOS PCT: 9.8 % (ref 3.0–12.0)
Monocytes Absolute: 0.6 10*3/uL (ref 0.1–1.0)
Neutro Abs: 3.6 10*3/uL (ref 1.4–7.7)
Neutrophils Relative %: 54.8 % (ref 43.0–77.0)
Platelets: 342 10*3/uL (ref 150.0–400.0)
RBC: 4.87 Mil/uL (ref 3.87–5.11)
RDW: 12.2 % (ref 11.5–15.5)
WBC: 6.5 10*3/uL (ref 4.0–10.5)

## 2015-04-16 LAB — BASIC METABOLIC PANEL
BUN: 14 mg/dL (ref 6–23)
CHLORIDE: 105 meq/L (ref 96–112)
CO2: 26 mEq/L (ref 19–32)
Calcium: 9.4 mg/dL (ref 8.4–10.5)
Creatinine, Ser: 0.76 mg/dL (ref 0.40–1.20)
GFR: 86.94 mL/min (ref >60.00)
Glucose, Bld: 79 mg/dL (ref 70–99)
POTASSIUM: 4.1 meq/L (ref 3.5–5.1)
Sodium: 138 mEq/L (ref 135–145)

## 2015-04-16 LAB — HEPATIC FUNCTION PANEL
ALBUMIN: 4.6 g/dL (ref 3.5–5.2)
ALK PHOS: 57 U/L (ref 39–117)
ALT: 9 U/L (ref 0–35)
AST: 20 U/L (ref 0–37)
Bilirubin, Direct: 0.2 mg/dL (ref 0.0–0.3)
TOTAL PROTEIN: 7.3 g/dL (ref 6.0–8.3)
Total Bilirubin: 0.6 mg/dL (ref 0.2–1.2)

## 2015-04-16 LAB — LIPID PANEL
CHOL/HDL RATIO: 2
Cholesterol: 167 mg/dL (ref 0–200)
HDL: 73.9 mg/dL (ref >39.00)
LDL Cholesterol: 83 mg/dL (ref 0–99)
NONHDL: 93.39
TRIGLYCERIDES: 51 mg/dL (ref 0.0–149.0)
VLDL: 10.2 mg/dL (ref 0.0–40.0)

## 2015-04-16 LAB — TSH: TSH: 1.33 u[IU]/mL (ref 0.35–4.50)

## 2015-04-19 ENCOUNTER — Encounter: Payer: BLUE CROSS/BLUE SHIELD | Admitting: Family Medicine

## 2015-05-14 ENCOUNTER — Ambulatory Visit (INDEPENDENT_AMBULATORY_CARE_PROVIDER_SITE_OTHER): Payer: BLUE CROSS/BLUE SHIELD | Admitting: Family Medicine

## 2015-05-14 ENCOUNTER — Encounter: Payer: Self-pay | Admitting: Family Medicine

## 2015-05-14 VITALS — BP 120/82 | HR 70 | Temp 97.9°F | Ht 67.32 in | Wt 166.9 lb

## 2015-05-14 DIAGNOSIS — Z0000 Encounter for general adult medical examination without abnormal findings: Secondary | ICD-10-CM

## 2015-05-14 DIAGNOSIS — Z23 Encounter for immunization: Secondary | ICD-10-CM

## 2015-05-14 NOTE — Progress Notes (Signed)
Pre visit review using our clinic review tool, if applicable. No additional management support is needed unless otherwise documented below in the visit note. 

## 2015-05-14 NOTE — Progress Notes (Signed)
   Subjective:    Patient ID: Debbie Velez, female    DOB: [birth year], 44 y.o.   MRN: [id number]  HPI Patient seen for complete physical. Generally very healthy. She sees gynecologist regularly for Pap smears and mammograms. Tetanus up-to-date. Never smoked. Walks regularly for exercise. Appetite and weight of been stable. She takes no medications.  Past Medical History  Diagnosis Date  . DYSPNEA 01/24/2010   Past Surgical History  Procedure Laterality Date  . Cesarean section      1993, 1997  . Mandible surgery  2001  . Abdomnaplasty  2004  . Irrigation and debridement hematoma      infected abdominal wall surgery 2008    reports that she has never smoked. She does not have any smokeless tobacco history on file. Her alcohol and drug histories are not on file. family history includes Alcohol abuse in her father and sister; Cancer in her father; Diabetes in her mother. No Known Allergies    Review of Systems  Constitutional: Negative for fever, activity change, appetite change, fatigue and unexpected weight change.  HENT: Negative for ear pain, hearing loss, sore throat and trouble swallowing.   Eyes: Negative for visual disturbance.  Respiratory: Negative for cough and shortness of breath.   Cardiovascular: Negative for chest pain and palpitations.  Gastrointestinal: Negative for abdominal pain, diarrhea, constipation and blood in stool.  Genitourinary: Negative for dysuria and hematuria.  Musculoskeletal: Negative for myalgias, back pain and arthralgias.  Skin: Negative for rash.  Neurological: Negative for dizziness, syncope and headaches.  Hematological: Negative for adenopathy.  Psychiatric/Behavioral: Negative for confusion and dysphoric mood.       Objective:   Physical Exam  Constitutional: She is oriented to person, place, and time. She appears well-developed and well-nourished.  HENT:  Head: Normocephalic and atraumatic.  Eyes: EOM are normal. Pupils are equal,  round, and reactive to light.  Neck: Normal range of motion. Neck supple. No thyromegaly present.  Cardiovascular: Normal rate, regular rhythm and normal heart sounds.   No murmur heard. Pulmonary/Chest: Breath sounds normal. No respiratory distress. She has no wheezes. She has no rales.  Abdominal: Soft. Bowel sounds are normal. She exhibits no distension and no mass. There is no tenderness. There is no rebound and no guarding.  Musculoskeletal: Normal range of motion. She exhibits no edema.  Lymphadenopathy:    She has no cervical adenopathy.  Neurological: She is alert and oriented to person, place, and time. She displays normal reflexes. No cranial nerve deficit.  Skin: No rash noted.  Psychiatric: She has a normal mood and affect. Her behavior is normal. Judgment and thought content normal.          Assessment & Plan:  Complete physical. Labs reviewed. No major concerns. Continue regular walking and weightbearing exercise. Suggest calcium vitamin D supplement. She will continue see gynecologist. Flu vaccine given

## 2015-08-25 ENCOUNTER — Other Ambulatory Visit: Payer: Self-pay

## 2015-08-25 DIAGNOSIS — Z1231 Encounter for screening mammogram for malignant neoplasm of breast: Secondary | ICD-10-CM

## 2015-09-17 ENCOUNTER — Other Ambulatory Visit: Payer: Self-pay | Admitting: Obstetrics and Gynecology

## 2015-09-17 ENCOUNTER — Ambulatory Visit
Admission: RE | Admit: 2015-09-17 | Discharge: 2015-09-17 | Disposition: A | Discharge status: Home / Self Care | Payer: BLUE CROSS/BLUE SHIELD | Source: Ambulatory Visit | Attending: Obstetrics and Gynecology | Admitting: Obstetrics and Gynecology

## 2015-09-17 DIAGNOSIS — N63 Unspecified lump in unspecified breast: Secondary | ICD-10-CM

## 2015-09-17 DIAGNOSIS — N6312 Unspecified lump in the right breast, upper inner quadrant: Secondary | ICD-10-CM

## 2015-10-08 ENCOUNTER — Ambulatory Visit: Payer: BLUE CROSS/BLUE SHIELD

## 2016-10-04 ENCOUNTER — Other Ambulatory Visit: Payer: Self-pay | Admitting: Obstetrics and Gynecology

## 2016-10-04 DIAGNOSIS — Z1231 Encounter for screening mammogram for malignant neoplasm of breast: Secondary | ICD-10-CM

## 2016-10-20 ENCOUNTER — Ambulatory Visit: Payer: BLUE CROSS/BLUE SHIELD

## 2016-11-06 ENCOUNTER — Ambulatory Visit
Admission: RE | Admit: 2016-11-06 | Discharge: 2016-11-06 | Disposition: A | Discharge status: Home / Self Care | Payer: BLUE CROSS/BLUE SHIELD | Source: Ambulatory Visit | Attending: Obstetrics and Gynecology | Admitting: Obstetrics and Gynecology

## 2016-11-06 DIAGNOSIS — Z1231 Encounter for screening mammogram for malignant neoplasm of breast: Secondary | ICD-10-CM

## 2016-11-29 ENCOUNTER — Encounter: Payer: Self-pay | Admitting: Family Medicine

## 2016-11-29 ENCOUNTER — Ambulatory Visit (INDEPENDENT_AMBULATORY_CARE_PROVIDER_SITE_OTHER): Payer: BLUE CROSS/BLUE SHIELD | Admitting: Family Medicine

## 2016-11-29 VITALS — BP 118/80 | HR 64 | Temp 98.1°F | Ht 67.5 in | Wt 169.3 lb

## 2016-11-29 DIAGNOSIS — Z0000 Encounter for general adult medical examination without abnormal findings: Secondary | ICD-10-CM | POA: Diagnosis not present

## 2016-11-29 LAB — CBC WITH DIFFERENTIAL/PLATELET
BASOS ABS: 0 10*3/uL (ref 0.0–0.1)
Basophils Relative: 0.6 % (ref 0.0–3.0)
EOS ABS: 0.1 10*3/uL (ref 0.0–0.7)
Eosinophils Relative: 2.2 % (ref 0.0–5.0)
HCT: 41.3 % (ref 36.0–46.0)
Hemoglobin: 13.7 g/dL (ref 12.0–15.0)
LYMPHS ABS: 1.7 10*3/uL (ref 0.7–4.0)
Lymphocytes Relative: 25.5 % (ref 12.0–46.0)
MCHC: 33.2 g/dL (ref 30.0–36.0)
MCV: 88.4 fl (ref 78.0–100.0)
MONO ABS: 0.6 10*3/uL (ref 0.1–1.0)
Monocytes Relative: 8.8 % (ref 3.0–12.0)
NEUTROS PCT: 62.9 % (ref 43.0–77.0)
Neutro Abs: 4.1 10*3/uL (ref 1.4–7.7)
Platelets: 334 10*3/uL (ref 150.0–400.0)
RBC: 4.68 Mil/uL (ref 3.87–5.11)
RDW: 13 % (ref 11.5–15.5)
WBC: 6.6 10*3/uL (ref 4.0–10.5)

## 2016-11-29 LAB — LIPID PANEL
CHOL/HDL RATIO: 3
Cholesterol: 183 mg/dL (ref 0–200)
HDL: 70 mg/dL (ref >39.00)
LDL Cholesterol: 101 mg/dL — ABNORMAL HIGH (ref 0–99)
NONHDL: 112.89
Triglycerides: 57 mg/dL (ref 0.0–149.0)
VLDL: 11.4 mg/dL (ref 0.0–40.0)

## 2016-11-29 LAB — BASIC METABOLIC PANEL
BUN: 19 mg/dL (ref 6–23)
CO2: 25 meq/L (ref 19–32)
Calcium: 9.6 mg/dL (ref 8.4–10.5)
Chloride: 104 mEq/L (ref 96–112)
Creatinine, Ser: 0.69 mg/dL (ref 0.40–1.20)
GFR: 96.52 mL/min (ref >60.00)
GLUCOSE: 89 mg/dL (ref 70–99)
POTASSIUM: 4.5 meq/L (ref 3.5–5.1)
Sodium: 137 mEq/L (ref 135–145)

## 2016-11-29 LAB — TSH: TSH: 1.31 u[IU]/mL (ref 0.35–4.50)

## 2016-11-29 LAB — HEPATIC FUNCTION PANEL
ALBUMIN: 4.5 g/dL (ref 3.5–5.2)
ALK PHOS: 53 U/L (ref 39–117)
ALT: 7 U/L (ref 0–35)
AST: 15 U/L (ref 0–37)
BILIRUBIN DIRECT: 0.1 mg/dL (ref 0.0–0.3)
Total Bilirubin: 0.5 mg/dL (ref 0.2–1.2)
Total Protein: 6.9 g/dL (ref 6.0–8.3)

## 2016-11-29 NOTE — Progress Notes (Signed)
Pre visit review using our clinic review tool, if applicable. No additional management support is needed unless otherwise documented below in the visit note. 

## 2016-11-29 NOTE — Progress Notes (Signed)
Subjective:     Patient ID: Debbie Velez, female   DOB: February 08, 1971, 46 y.o.   MRN: [id number]  HPI Patient here for physical exam. She sees gynecologist regularly. She is getting panel yearly Pap smears and also regular mammograms through their office. Generally very healthy. She is currently taking clindamycin per dentist with no GI side effects. No other regular daily medications. Nonsmoker. Exercises with walking about 3 miles per day. No specific complaints today. Appetite and weight stable.  Past Medical History:  Diagnosis Date  . DYSPNEA 01/24/2010   Past Surgical History:  Procedure Laterality Date  . abdomnaplasty  2004  . CESAREAN SECTION     1993, 1997  . IRRIGATION AND DEBRIDEMENT HEMATOMA     infected abdominal wall surgery 2008  . MANDIBLE SURGERY  2001    reports that she has never smoked. She has never used smokeless tobacco. She reports that she drinks alcohol. She reports that she does not use drugs. family history includes Alcohol abuse in her father and sister; Cancer in her father; Diabetes in her mother. No Known Allergies   Review of Systems  Constitutional: Negative for activity change, appetite change, fatigue, fever and unexpected weight change.  HENT: Negative for ear pain, hearing loss, sore throat and trouble swallowing.   Eyes: Negative for visual disturbance.  Respiratory: Negative for cough and shortness of breath.   Cardiovascular: Negative for chest pain and palpitations.  Gastrointestinal: Negative for abdominal pain, blood in stool, constipation and diarrhea.  Genitourinary: Negative for dysuria and hematuria.  Musculoskeletal: Negative for arthralgias, back pain and myalgias.  Skin: Negative for rash.  Neurological: Negative for dizziness, syncope and headaches.  Hematological: Negative for adenopathy.  Psychiatric/Behavioral: Negative for confusion and dysphoric mood.       Objective:   Physical Exam  Constitutional: She is oriented to  person, place, and time. She appears well-developed and well-nourished.  HENT:  Head: Normocephalic and atraumatic.  Eyes: EOM are normal. Pupils are equal, round, and reactive to light.  Neck: Normal range of motion. Neck supple. No thyromegaly present.  Cardiovascular: Normal rate, regular rhythm and normal heart sounds.   No murmur heard. Pulmonary/Chest: Breath sounds normal. No respiratory distress. She has no wheezes. She has no rales.  Abdominal: Soft. Bowel sounds are normal. She exhibits no distension and no mass. There is no tenderness. There is no rebound and no guarding.  Musculoskeletal: Normal range of motion. She exhibits no edema.  Lymphadenopathy:    She has no cervical adenopathy.  Neurological: She is alert and oriented to person, place, and time. She displays normal reflexes. No cranial nerve deficit.  Skin: No rash noted.  Psychiatric: She has a normal mood and affect. Her behavior is normal. Judgment and thought content normal.       Assessment:     Physical exam. Patient is generally very healthy. She sees gynecologist yearly. Immunizations up-to-date.    Plan:     -Obtain screening lab work. We discussed HIV screening and she basically has no risk factors and declined screening for that. -Continue regular weightbearing exercise -She will continue GYN follow-up  Kristian Covey MD [facility]

## 2017-04-26 ENCOUNTER — Encounter: Payer: Self-pay | Admitting: Family Medicine

## 2017-12-25 ENCOUNTER — Other Ambulatory Visit: Payer: Self-pay | Admitting: Radiology

## 2017-12-25 DIAGNOSIS — Z1231 Encounter for screening mammogram for malignant neoplasm of breast: Secondary | ICD-10-CM

## 2018-01-15 ENCOUNTER — Ambulatory Visit
Admission: RE | Admit: 2018-01-15 | Discharge: 2018-01-15 | Disposition: A | Discharge status: Home / Self Care | Payer: BLUE CROSS/BLUE SHIELD | Source: Ambulatory Visit | Attending: Radiology | Admitting: Radiology

## 2018-01-15 DIAGNOSIS — Z1231 Encounter for screening mammogram for malignant neoplasm of breast: Secondary | ICD-10-CM

## 2018-02-13 ENCOUNTER — Encounter: Payer: Self-pay | Admitting: Family Medicine

## 2018-02-13 ENCOUNTER — Ambulatory Visit (INDEPENDENT_AMBULATORY_CARE_PROVIDER_SITE_OTHER): Payer: BLUE CROSS/BLUE SHIELD | Admitting: Family Medicine

## 2018-02-13 VITALS — BP 130/90 | HR 74 | Temp 98.1°F | Wt 179.0 lb

## 2018-02-13 DIAGNOSIS — J029 Acute pharyngitis, unspecified: Secondary | ICD-10-CM | POA: Diagnosis not present

## 2018-02-13 DIAGNOSIS — J36 Peritonsillar abscess: Secondary | ICD-10-CM

## 2018-02-13 LAB — POCT RAPID STREP A (OFFICE): Rapid Strep A Screen: NEGATIVE

## 2018-02-13 MED ORDER — AMOXICILLIN-POT CLAVULANATE 875-125 MG PO TABS: 1.0000 | ORAL_TABLET | Freq: Two times a day (BID) | ORAL | 0 refills | Status: DC

## 2018-02-13 NOTE — Patient Instructions (Signed)
Peritonsillar Cellulitis  Peritonsillar cellulitis is an infection around a tonsil that results in a severe sore throat. If the condition is not treated, pus can collect in the throat.  What are the causes?  Peritonsillar cellulitis is usually caused by a combination of several strains of bacteria.  What increases the risk?  This condition is more likely to develop in:   People who have frequent tonsil infections.   People who take antibiotic medicines frequently.   People who smoke.    What are the signs or symptoms?  Early symptoms of this condition include:   A fever.   Chills.   Soreness on one side of the throat.   Pain in one ear.   Pain when swallowing.   Tiredness.    Later symptoms include:   Severe pain when swallowing.   Drooling.   Trouble opening the mouth wide.   Bad breath.   Changes in the voice.    How is this diagnosed?  This condition may be diagnosed based on symptoms, a physical exam, and a test in which a sample of fluid from the throat is tested (throat culture). You may also have a blood test.  How is this treated?  This condition is usually treated with antibiotic medicines. You may need to take these medicines by mouth or through an IV tube. Additional treatment may include:   Medicines for pain, fever, or swelling. Some medicines may be given through an IV tube.   A procedure to drain a collection of pus (abscess).   Surgery to remove the tonsils (tonsillectomy). This may be done if you get this condition often.    Follow these instructions at home:  Eating and drinking   If it is hard to swallow, try switching to a liquid or soft-food diet until you get better.   Drink enough fluid to keep your urine clear or pale yellow.  Medicines   Take your antibiotic medicine as told by your health care provider. Do not stop taking the antibiotic even if you start to feel better.   Take over-the-counter and prescription medicines only as told by your health care  provider.  Activity   Rest and get plenty of sleep.   Return to work or school as directed by your health care provider.  General instructions   Do not smoke.   Keep all follow-up visits as told by your health care provider. This is important.   To help ease pain and swelling, gargle with a salt-water mixture 3-4 times per day or as needed. To make a salt-water mixture, completely dissolve -1 tsp of salt in 1 cup of warm water.  Contact a health care provider if:   Your swelling gets worse.   You have difficulty swallowing.   You are unable to take your antibiotic.   You have a fever that does not improve after you take medicine.   Your voice changes.  Get help right away if:   You have trouble breathing.   Your pain gets worse.   You see pus around or near your tonsils.   You cough up bloody spit.   You are unable to swallow.   You are drooling.  This information is not intended to replace advice given to you by your health care provider. Make sure you discuss any questions you have with your health care provider.  Document Released: 10/18/2009 Document Revised: 12/30/2015 Document Reviewed: 07/20/2014  Elsevier Interactive Patient Education  2018 [employer]

## 2018-02-13 NOTE — Progress Notes (Signed)
  Subjective:     Patient ID: Nolon StallsKathryn H Mcwherter, female   DOB: 26-Jun-1971, 47 y.o.   MRN: [id number]  HPI Patient seen with onset of sore throat this past "Sunday. She denies any cough. No nasal congestion. Rare headache. She had some dental cleaning about a week ago but does not recall any injury to the throat region. She's not had any fevers or chills. No recent sick exposures. Sore throat symptoms are only right side and she's also had some painful lymph nodes on that side. No recent appetite or weight changes Never smoked.  Past Medical History:  Diagnosis Date  . DYSPNEA 01/24/2010   Past Surgical History:  Procedure Laterality Date  . abdomnaplasty  2004  . CESAREAN SECTION     19" 93, 1997  . IRRIGATION AND DEBRIDEMENT HEMATOMA     infected abdominal wall surgery 2008  . MANDIBLE SURGERY  2001    reports that she has never smoked. She has never used smokeless tobacco. She reports that she drinks alcohol. She reports that she does not use drugs. family history includes Alcohol abuse in her father and sister; Cancer in her father; Diabetes in her mother. No Known Allergies   Review of Systems  Constitutional: Negative for chills and fever.  HENT: Positive for sore throat.   Respiratory: Negative for cough.   Neurological: Negative for headaches.       Objective:   Physical Exam  Constitutional: She appears well-developed and well-nourished.  HENT:  Right Ear: Tympanic membrane normal.  Left Ear: Tympanic membrane normal.  Mouth/Throat: No tonsillar exudate.   erythema right tonsil region. No evidence for tonsillar abscess. No exudate. Left side appears normal. No uvula involvement  Neck: Neck supple.  Tender anterior cervical nodes on the right side but not the left  Skin: No rash noted.       Assessment:     Sore throat. Patient has evidence for probable early right peritonsillar cellulitis. No evidence for abscess. Rapid strep negative    Plan:     -Start Augmentin  875 mg twice daily with food for 10 days -Advil or Aleve as needed for sore throat symptoms -Touch base by next week if symptoms not resolving and sooner as needed for worsening symptoms  Kristian CoveyBruce W Kiyani Jernigan MD  [facility]

## 2018-02-14 ENCOUNTER — Emergency Department (HOSPITAL_COMMUNITY)
Admission: EM | Admit: 2018-02-14 | Discharge: 2018-02-14 | Disposition: A | Discharge status: Home / Self Care | Payer: BLUE CROSS/BLUE SHIELD | Attending: Emergency Medicine | Admitting: Emergency Medicine

## 2018-02-14 ENCOUNTER — Other Ambulatory Visit: Payer: Self-pay

## 2018-02-14 ENCOUNTER — Encounter (HOSPITAL_COMMUNITY): Payer: Self-pay | Admitting: Emergency Medicine

## 2018-02-14 ENCOUNTER — Emergency Department (HOSPITAL_COMMUNITY): Payer: BLUE CROSS/BLUE SHIELD

## 2018-02-14 DIAGNOSIS — R070 Pain in throat: Secondary | ICD-10-CM | POA: Diagnosis present

## 2018-02-14 DIAGNOSIS — J039 Acute tonsillitis, unspecified: Secondary | ICD-10-CM | POA: Insufficient documentation

## 2018-02-14 LAB — CBC WITH DIFFERENTIAL/PLATELET
Abs Immature Granulocytes: 0.1 10*3/uL (ref 0.0–0.1)
BASOS PCT: 0 %
Basophils Absolute: 0 10*3/uL (ref 0.0–0.1)
EOS ABS: 0.1 10*3/uL (ref 0.0–0.7)
EOS PCT: 1 %
HCT: 40.8 % (ref 36.0–46.0)
Hemoglobin: 13.4 g/dL (ref 12.0–15.0)
IMMATURE GRANULOCYTES: 0 %
Lymphocytes Relative: 12 %
Lymphs Abs: 1.3 10*3/uL (ref 0.7–4.0)
MCH: 29.3 pg (ref 26.0–34.0)
MCHC: 32.8 g/dL (ref 30.0–36.0)
MCV: 89.3 fL (ref 78.0–100.0)
Monocytes Absolute: 1.1 10*3/uL — ABNORMAL HIGH (ref 0.1–1.0)
Monocytes Relative: 10 %
NEUTROS PCT: 77 %
Neutro Abs: 8.8 10*3/uL — ABNORMAL HIGH (ref 1.7–7.7)
PLATELETS: 298 10*3/uL (ref 150–400)
RBC: 4.57 MIL/uL (ref 3.87–5.11)
RDW: 12 % (ref 11.5–15.5)
WBC: 11.4 10*3/uL — AB (ref 4.0–10.5)

## 2018-02-14 LAB — BASIC METABOLIC PANEL
Anion gap: 8 (ref 5–15)
BUN: 12 mg/dL (ref 6–20)
CALCIUM: 9 mg/dL (ref 8.9–10.3)
CO2: 23 mmol/L (ref 22–32)
CREATININE: 0.7 mg/dL (ref 0.44–1.00)
Chloride: 108 mmol/L (ref 98–111)
GLUCOSE: 110 mg/dL — AB (ref 70–99)
Potassium: 4 mmol/L (ref 3.5–5.1)
Sodium: 139 mmol/L (ref 135–145)

## 2018-02-14 MED ORDER — SODIUM CHLORIDE 0.9 % IV BOLUS
1000.0000 mL | Freq: Once | INTRAVENOUS | Status: AC
  Administered 2018-02-14: 1000 mL via INTRAVENOUS

## 2018-02-14 MED ORDER — KETOROLAC TROMETHAMINE 30 MG/ML IJ SOLN
30.0000 mg | Freq: Once | INTRAMUSCULAR | Status: AC
  Administered 2018-02-14: 30 mg via INTRAVENOUS
  Filled 2018-02-14: qty 1

## 2018-02-14 MED ORDER — DEXAMETHASONE SODIUM PHOSPHATE 10 MG/ML IJ SOLN
10.0000 mg | Freq: Once | INTRAMUSCULAR | Status: AC
  Administered 2018-02-14: 10 mg via INTRAVENOUS
  Filled 2018-02-14: qty 1

## 2018-02-14 MED ORDER — IOHEXOL 300 MG/ML  SOLN
100.0000 mL | Freq: Once | INTRAMUSCULAR | Status: AC | PRN
  Administered 2018-02-14: 100 mL via INTRAVENOUS

## 2018-02-14 MED ORDER — SODIUM CHLORIDE 0.9 % IV SOLN
3.0000 g | Freq: Once | INTRAVENOUS | Status: AC
  Administered 2018-02-14: 3 g via INTRAVENOUS
  Filled 2018-02-14: qty 3

## 2018-02-14 NOTE — ED Triage Notes (Signed)
Pt reports she has had a sore throat since Sunday, saw the GP today and was told she has an abscess in her throat. She started her antibiotic however today she woke up and reports she feels as if her throat is swollen and "closing up".  Pt is not experiencing SOB and is able to speak in complete sentences.  States she is unable to swallow.

## 2018-02-14 NOTE — ED Provider Notes (Signed)
[facility] Provider Note   CSN: [id number] Arrival date & time: 02/14/18  0102     History   Chief Complaint Chief Complaint  Patient presents with  . Sore Throat    HPI Debbie Velez is a 47 y.o. female.  Patient presents to the emergency department for evaluation of worsening sore throat.  Patient has had a sore throat for a couple of days.  She was seen by her doctor and was diagnosed with tonsillar cellulitis, started on Augmentin.  She has taken a couple of doses, but tonight she started having sensation of her throat feeling more swollen.  She reports that she became very concerned because she lives alone, presented to the ER for further evaluation.     Past Medical History:  Diagnosis Date  . DYSPNEA 01/24/2010    Patient Active Problem List   Diagnosis Date Noted  . DYSPNEA 01/24/2010    Past Surgical History:  Procedure Laterality Date  . abdomnaplasty  2004  . CESAREAN SECTION     1993, 1997  . IRRIGATION AND DEBRIDEMENT HEMATOMA     infected abdominal wall surgery 2008  . MANDIBLE SURGERY  2001     OB History   None      Home Medications    Prior to Admission medications   Medication Sig Start Date End Date Taking? Authorizing Provider  amoxicillin-clavulanate (AUGMENTIN) 875-125 MG tablet Take 1 tablet by mouth 2 (two) times daily. 02/13/18  Yes Burchette, Elberta FortisBruce W, MD  ibuprofen (ADVIL,MOTRIN) 200 MG tablet Take 600 mg by mouth every 6 (six) hours as needed for mild pain.   Yes [provider]    Family History Family History  Problem Relation Age of Onset  . Diabetes Mother        type ll  . Alcohol abuse Father   . Cancer Father   . Alcohol abuse Sister     Social History Social History   Tobacco Use  . Smoking status: Never Smoker  . Smokeless tobacco: Never Used  Substance Use Topics  . Alcohol use: Yes  . Drug use: No     Allergies   Patient has no known  allergies.   Review of Systems Review of Systems  HENT: Positive for sore throat.   All other systems reviewed and are negative.    Physical Exam Updated Vital Signs BP 130/80   Pulse 72   Temp 98.5 F (36.9 C) (Oral)   Resp 18   Ht 5\' 8"  (1.727 m)   Wt 81.2 kg (179 lb)   SpO2 97%   BMI 27.22 kg/m   Physical Exam  Constitutional: She is oriented to person, place, and time. She appears well-developed and well-nourished. No distress.  HENT:  Head: Normocephalic and atraumatic.  Right Ear: Hearing normal.  Left Ear: Hearing normal.  Nose: Nose normal.  Mouth/Throat: Mucous membranes are normal. Posterior oropharyngeal edema (Right side) and posterior oropharyngeal erythema (Right side) present.  Eyes: Pupils are equal, round, and reactive to light. Conjunctivae and EOM are normal.  Neck: Normal range of motion. Neck supple.  Cardiovascular: Regular rhythm, S1 normal and S2 normal. Exam reveals no gallop and no friction rub.  No murmur heard. Pulmonary/Chest: Effort normal and breath sounds normal. No respiratory distress. She exhibits no tenderness.  Abdominal: Soft. Normal appearance and bowel sounds are normal. There is no hepatosplenomegaly. There is no tenderness. There is no rebound, no guarding, no tenderness at McBurney's point  and negative Murphy's sign. No hernia.  Musculoskeletal: Normal range of motion.  Neurological: She is alert and oriented to person, place, and time. She has normal strength. No cranial nerve deficit or sensory deficit. Coordination normal. GCS eye subscore is 4. GCS verbal subscore is 5. GCS motor subscore is 6.  Skin: Skin is warm, dry and intact. No rash noted. No cyanosis.  Psychiatric: She has a normal mood and affect. Her speech is normal and behavior is normal. Thought content normal.  Nursing note and vitals reviewed.    ED Treatments / Results  Labs (all labs ordered are listed, but only abnormal results are displayed) Labs Reviewed   CBC WITH DIFFERENTIAL/PLATELET - Abnormal; Notable for the following components:      Result Value   WBC 11.4 (*)    Neutro Abs 8.8 (*)    Monocytes Absolute 1.1 (*)    All other components within normal limits  BASIC METABOLIC PANEL - Abnormal; Notable for the following components:   Glucose, Bld 110 (*)    All other components within normal limits    EKG None  Radiology Ct Soft Tissue Neck W Contrast  Result Date: 02/14/2018 CLINICAL DATA:  Sore throat, on antibiotics for throat abscess. Worsening throat swelling. EXAM: CT NECK WITH CONTRAST TECHNIQUE: Multidetector CT imaging of the neck was performed using the standard protocol following the bolus administration of intravenous contrast. CONTRAST:  OMNIPAQUE IOHEXOL 300 MG/ML  SOLN COMPARISON:  None. FINDINGS: PHARYNX AND LARYNX: Enlarged RIGHT palatine tonsillolith striated enhancement. No abscess. Preservation of the parapharyngeal fat tissue planes. Mild RIGHT hypopharyngeal edema extending to the RIGHT glossal epiglottic and glossopharyngeal folds. Patent airway. Normal larynx. SALIVARY GLANDS: Normal. THYROID: Normal. LYMPH NODES: 10 mm RIGHT level 2A reniform lymph node with homogeneous enhancement. Additional smaller predominant RIGHT-sided lymph nodes. VASCULAR: Normal. LIMITED INTRACRANIAL: Normal. VISUALIZED ORBITS: Normal. MASTOIDS AND VISUALIZED PARANASAL SINUSES: Well-aerated. Anterior maxillary wall plate and screw fixation. SKELETON: Nonacute. No CT findings of dental pathology. Moderate C5-6 and mild C6-7 degenerative discs. Moderate LEFT C5-6 neural foraminal narrowing. UPPER CHEST: Lung apices are clear. No superior mediastinal lymphadenopathy. OTHER: None. IMPRESSION: 1. Acute RIGHT tonsillitis and pharyngitis without abscess. Patent airway. 2. Borderline reactive RIGHT lymphadenopathy. Electronically Signed   By: Awilda Metro M.D.   On: 02/14/2018 03:47    Procedures Procedures (including critical care  time)  Medications Ordered in ED Medications  Ampicillin-Sulbactam (UNASYN) 3 g in sodium chloride 0.9 % 100 mL IVPB (has no administration in time range)  sodium chloride 0.9 % bolus 1,000 mL (1,000 mLs Intravenous New Bag/Given 02/14/18 0224)  dexamethasone (DECADRON) injection 10 mg (10 mg Intravenous Given 02/14/18 0223)  iohexol (OMNIPAQUE) 300 MG/ML solution 100 mL (100 mLs Intravenous Contrast Given 02/14/18 0328)     Initial Impression / Assessment and Plan / ED Course  I have reviewed the triage vital signs and the nursing notes.  Pertinent labs & imaging results that were available during my care of the patient were reviewed by me and considered in my medical decision making (see chart for details).     Patient presents with worsening sore throat and feeling like her throat is swelling.  She does have erythema and edema, predominantly on the right side.  She is afebrile.  She is swallowing without difficulty, handling her secretions.  CT scan shows evidence of tonsillitis without abscess formation.  Patient administered Decadron and Unasyn, will continue Augmentin.  Final Clinical Impressions(s) / ED Diagnoses   Final  diagnoses:  Tonsillitis    ED Discharge Orders    None       Gilda Crease, MD 02/14/18 [phone]

## 2018-04-04 ENCOUNTER — Encounter: Payer: Self-pay | Admitting: Family Medicine

## 2018-04-04 ENCOUNTER — Ambulatory Visit (INDEPENDENT_AMBULATORY_CARE_PROVIDER_SITE_OTHER): Payer: BLUE CROSS/BLUE SHIELD | Admitting: Family Medicine

## 2018-04-04 VITALS — BP 110/80 | HR 73 | Temp 98.0°F | Ht 67.5 in | Wt 173.7 lb

## 2018-04-04 DIAGNOSIS — Z0000 Encounter for general adult medical examination without abnormal findings: Secondary | ICD-10-CM

## 2018-04-04 LAB — LIPID PANEL
CHOLESTEROL: 192 mg/dL (ref 0–200)
HDL: 60.7 mg/dL (ref >39.00)
LDL CALC: 113 mg/dL — AB (ref 0–99)
NONHDL: 131.34
Total CHOL/HDL Ratio: 3
Triglycerides: 91 mg/dL (ref 0.0–149.0)
VLDL: 18.2 mg/dL (ref 0.0–40.0)

## 2018-04-04 LAB — CBC WITH DIFFERENTIAL/PLATELET
BASOS PCT: 0.6 % (ref 0.0–3.0)
Basophils Absolute: 0 10*3/uL (ref 0.0–0.1)
EOS ABS: 0.1 10*3/uL (ref 0.0–0.7)
Eosinophils Relative: 2.6 % (ref 0.0–5.0)
HEMATOCRIT: 42.1 % (ref 36.0–46.0)
HEMOGLOBIN: 14.3 g/dL (ref 12.0–15.0)
LYMPHS PCT: 30 % (ref 12.0–46.0)
Lymphs Abs: 1.7 10*3/uL (ref 0.7–4.0)
MCHC: 34 g/dL (ref 30.0–36.0)
MCV: 87.4 fl (ref 78.0–100.0)
Monocytes Absolute: 0.6 10*3/uL (ref 0.1–1.0)
Monocytes Relative: 10.7 % (ref 3.0–12.0)
Neutro Abs: 3.2 10*3/uL (ref 1.4–7.7)
Neutrophils Relative %: 56.1 % (ref 43.0–77.0)
Platelets: 375 10*3/uL (ref 150.0–400.0)
RBC: 4.82 Mil/uL (ref 3.87–5.11)
RDW: 12.9 % (ref 11.5–15.5)
WBC: 5.8 10*3/uL (ref 4.0–10.5)

## 2018-04-04 LAB — BASIC METABOLIC PANEL
BUN: 16 mg/dL (ref 6–23)
CHLORIDE: 105 meq/L (ref 96–112)
CO2: 26 mEq/L (ref 19–32)
Calcium: 9.7 mg/dL (ref 8.4–10.5)
Creatinine, Ser: 0.96 mg/dL (ref 0.40–1.20)
GFR: 65.56 mL/min (ref >60.00)
GLUCOSE: 99 mg/dL (ref 70–99)
Potassium: 4.8 mEq/L (ref 3.5–5.1)
Sodium: 138 mEq/L (ref 135–145)

## 2018-04-04 LAB — HEPATIC FUNCTION PANEL
ALK PHOS: 73 U/L (ref 39–117)
ALT: 18 U/L (ref 0–35)
AST: 29 U/L (ref 0–37)
Albumin: 4.7 g/dL (ref 3.5–5.2)
BILIRUBIN DIRECT: 0.1 mg/dL (ref 0.0–0.3)
BILIRUBIN TOTAL: 0.5 mg/dL (ref 0.2–1.2)
Total Protein: 7.3 g/dL (ref 6.0–8.3)

## 2018-04-04 LAB — TSH: TSH: 1.58 u[IU]/mL (ref 0.35–4.50)

## 2018-04-04 NOTE — Progress Notes (Signed)
  Subjective:     Patient ID: Debbie Velez, female   DOB: 09-Jan-1971, 47 y.o.   MRN: [id number]  HPI Patient seen for physical exam. She sees GYN and saw gynecologist this summer had Pap smear and mammogram.she had tonsillar cellulitis last summer and ended up seeing ENT was treated with a couple courses of antibiotics. Those symptoms have finally resolved. She had recent UTI treated by GYN in June and then again in August. Currently stable. She ended up having allergic type reaction to Cipro and Pyridium  She'll turn 48 next year. She walks regular for exercise. Never smoked. Tetanus up-to-date.   Past Medical History:  Diagnosis Date  . DYSPNEA 01/24/2010   Past Surgical History:  Procedure Laterality Date  . abdomnaplasty  2004  . CESAREAN SECTION     1993, 1997  . IRRIGATION AND DEBRIDEMENT HEMATOMA     infected abdominal wall surgery 2008  . MANDIBLE SURGERY  2001    reports that she has never smoked. She has never used smokeless tobacco. She reports that she drinks alcohol. She reports that she does not use drugs. family history includes Alcohol abuse in her father and sister; Cancer in her father; Diabetes in her mother. Allergies  Allergen Reactions  . Ciprofloxacin Itching  . Pyridium [Phenazopyridine] Swelling     Review of Systems  Constitutional: Negative for activity change, appetite change, fatigue, fever and unexpected weight change.  HENT: Negative for ear pain, hearing loss, sore throat and trouble swallowing.   Eyes: Negative for visual disturbance.  Respiratory: Negative for cough and shortness of breath.   Cardiovascular: Negative for chest pain and palpitations.  Gastrointestinal: Negative for abdominal pain, blood in stool, constipation and diarrhea.  Genitourinary: Negative for dysuria and hematuria.  Musculoskeletal: Negative for arthralgias, back pain and myalgias.  Skin: Negative for rash.  Neurological: Negative for dizziness, syncope and headaches.   Hematological: Negative for adenopathy.  Psychiatric/Behavioral: Negative for confusion and dysphoric mood.       Objective:   Physical Exam  Constitutional: She is oriented to person, place, and time. She appears well-developed and well-nourished.  HENT:  Head: Normocephalic and atraumatic.  Eyes: Pupils are equal, round, and reactive to light. EOM are normal.  Neck: Normal range of motion. Neck supple. No thyromegaly present.  Cardiovascular: Normal rate, regular rhythm and normal heart sounds.  No murmur heard. Pulmonary/Chest: Breath sounds normal. No respiratory distress. She has no wheezes. She has no rales.  Genitourinary:  Genitourinary Comments: Per gyn  Musculoskeletal: Normal range of motion. She exhibits no edema.  Lymphadenopathy:    She has no cervical adenopathy.  Neurological: She is alert and oriented to person, place, and time. She displays normal reflexes. No cranial nerve deficit.  Skin: No rash noted.  Psychiatric: She has a normal mood and affect. Her behavior is normal. Judgment and thought content normal.       Assessment:     Physical exam. The following issues were addressed    Plan:     -check screening lab work -Patient plans to get flu vaccine later this fall -She will continue with yearly follow-up with GYN -Consider shingles vaccine after age 49 -We discussed colon cancer screening after age 950  Kristian CoveyBruce W Mylo Driskill MD [facility]

## 2018-04-04 NOTE — Addendum Note (Signed)
Addended by: Bonnye FavaKWEI, NANA K on: 04/04/2018 11:17 AM   Modules accepted: Orders

## 2018-04-05 ENCOUNTER — Encounter: Payer: BLUE CROSS/BLUE SHIELD | Admitting: Family Medicine

## 2018-04-05 LAB — VARICELLA ZOSTER ANTIBODY, IGG: Varicella IgG: 963.6 index

## 2018-04-08 ENCOUNTER — Encounter: Payer: Self-pay | Admitting: Family Medicine

## 2018-04-17 ENCOUNTER — Ambulatory Visit (INDEPENDENT_AMBULATORY_CARE_PROVIDER_SITE_OTHER): Payer: BLUE CROSS/BLUE SHIELD | Admitting: Family Medicine

## 2018-04-17 ENCOUNTER — Ambulatory Visit: Payer: BLUE CROSS/BLUE SHIELD | Admitting: Family Medicine

## 2018-04-17 VITALS — BP 110/80 | HR 71 | Temp 98.4°F

## 2018-04-17 DIAGNOSIS — R52 Pain, unspecified: Secondary | ICD-10-CM

## 2018-04-17 DIAGNOSIS — R509 Fever, unspecified: Secondary | ICD-10-CM

## 2018-04-17 LAB — POC INFLUENZA A&B (BINAX/QUICKVUE)
Influenza A, POC: NEGATIVE
Influenza B, POC: NEGATIVE

## 2018-04-17 NOTE — Progress Notes (Signed)
  Subjective:     Patient ID: Debbie Velez, female   DOB: 17-Aug-1970, 47 y.o.   MRN: [id number]  HPI Patient seen with 2 day history of fever. She's had fever up 102. She said some mild headache. Body aches and intermittent sweats. Denies any cough, sore throat, nasal congestion, nausea, vomiting, or diarrhea. No skin rashes. No recent tick bite. Husband had very similar symptoms last week and is gradually recovering. No recent travels. No dysuria. No abdominal pain. Patient requesting flu testing. No known sick contacts other than her husband  Past Medical History:  Diagnosis Date  . DYSPNEA 01/24/2010   Past Surgical History:  Procedure Laterality Date  . abdomnaplasty  2004  . CESAREAN SECTION     1993, 1997  . IRRIGATION AND DEBRIDEMENT HEMATOMA     infected abdominal wall surgery 2008  . MANDIBLE SURGERY  2001    reports that she has never smoked. She has never used smokeless tobacco. She reports that she drinks alcohol. She reports that she does not use drugs. family history includes Alcohol abuse in her father and sister; Cancer in her father; Diabetes in her mother. Allergies  Allergen Reactions  . Ciprofloxacin Itching  . Pyridium [Phenazopyridine] Swelling     Review of Systems  Constitutional: Positive for chills, fatigue and fever.  HENT: Negative for congestion, sinus pressure, sinus pain and sore throat.   Respiratory: Negative for cough and shortness of breath.   Gastrointestinal: Negative for abdominal pain.  Genitourinary: Negative for dysuria.  Neurological: Positive for headaches.       Objective:   Physical Exam  Constitutional: She appears well-developed and well-nourished.  HENT:  Mouth/Throat: Oropharynx is clear and moist.  Neck: Neck supple.  Cardiovascular: Normal rate and regular rhythm.  Pulmonary/Chest: Effort normal and breath sounds normal. She has no wheezes. She has no rales.  Lymphadenopathy:    She has no cervical adenopathy.  Skin:  No rash noted.       Assessment:     Febrile illness. Suspect viral syndrome. Patient nontoxic in appearance with nonfocal exam. Influenza screen negative.    Plan:     -continue conservative treatment with fluids, rest, Advil, Aleve or Tylenol and follow-up for any persistent fever or worsening symptoms  Kristian Covey MD [facility] at [facility]

## 2018-04-17 NOTE — Patient Instructions (Signed)
Viral Illness, Adult Viruses are tiny germs that can get into a person's body and cause illness. There are many different types of viruses, and they cause many types of illness. Viral illnesses can range from mild to severe. They can affect various parts of the body. Common illnesses that are caused by a virus include colds and the flu. Viral illnesses also include serious conditions such as HIV/AIDS (human immunodeficiency virus/acquired immunodeficiency syndrome). A few viruses have been linked to certain cancers. What are the causes? Many types of viruses can cause illness. Viruses invade cells in your body, multiply, and cause the infected cells to malfunction or die. When the cell dies, it releases more of the virus. When this happens, you develop symptoms of the illness, and the virus continues to spread to other cells. If the virus takes over the function of the cell, it can cause the cell to divide and grow out of control, as is the case when a virus causes cancer. Different viruses get into the body in different ways. You can get a virus by:  Swallowing food or water that is contaminated with the virus.  Breathing in droplets that have been coughed or sneezed into the air by an infected person.  Touching a surface that has been contaminated with the virus and then touching your eyes, nose, or mouth.  Being bitten by an insect or animal that carries the virus.  Having sexual contact with a person who is infected with the virus.  Being exposed to blood or fluids that contain the virus, either through an open cut or during a transfusion.  If a virus enters your body, your body's defense system (immune system) will try to fight the virus. You may be at higher risk for a viral illness if your immune system is weak. What are the signs or symptoms? Symptoms vary depending on the type of virus and the location of the cells that it invades. Common symptoms of the main types of viral illnesses  include: Cold and flu viruses  Fever.  Headache.  Sore throat.  Muscle aches.  Nasal congestion.  Cough. Digestive system (gastrointestinal) viruses  Fever.  Abdominal pain.  Nausea.  Diarrhea. Liver viruses (hepatitis)  Loss of appetite.  Tiredness.  Yellowing of the skin (jaundice). Brain and spinal cord viruses  Fever.  Headache.  Stiff neck.  Nausea and vomiting.  Confusion or sleepiness. Skin viruses  Warts.  Itching.  Rash. Sexually transmitted viruses  Discharge.  Swelling.  Redness.  Rash. How is this treated? Viruses can be difficult to treat because they live within cells. Antibiotic medicines do not treat viruses because these drugs do not get inside cells. Treatment for a viral illness may include:  Resting and drinking plenty of fluids.  Medicines to relieve symptoms. These can include over-the-counter medicine for pain and fever, medicines for cough or congestion, and medicines to relieve diarrhea.  Antiviral medicines. These drugs are available only for certain types of viruses. They may help reduce flu symptoms if taken early. There are also many antiviral medicines for hepatitis and HIV/AIDS.  Some viral illnesses can be prevented with vaccinations. A common example is the flu shot. Follow these instructions at home: Medicines   Take over-the-counter and prescription medicines only as told by your health care provider.  If you were prescribed an antiviral medicine, take it as told by your health care provider. Do not stop taking the medicine even if you start to feel better.  Be aware   of when antibiotics are needed and when they are not needed. Antibiotics do not treat viruses. If your health care provider thinks that you may have a bacterial infection as well as a viral infection, you may get an antibiotic. ? Do not ask for an antibiotic prescription if you have been diagnosed with a viral illness. That will not make your  illness go away faster. ? Frequently taking antibiotics when they are not needed can lead to antibiotic resistance. When this develops, the medicine no longer works against the bacteria that it normally fights. General instructions  Drink enough fluids to keep your urine clear or pale yellow.  Rest as much as possible.  Return to your normal activities as told by your health care provider. Ask your health care provider what activities are safe for you.  Keep all follow-up visits as told by your health care provider. This is important. How is this prevented? Take these actions to reduce your risk of viral infection:  Eat a healthy diet and get enough rest.  Wash your hands often with soap and water. This is especially important when you are in public places. If soap and water are not available, use hand sanitizer.  Avoid close contact with friends and family who have a viral illness.  If you travel to areas where viral gastrointestinal infection is common, avoid drinking water or eating raw food.  Keep your immunizations up to date. Get a flu shot every year as told by your health care provider.  Do not share toothbrushes, nail clippers, razors, or needles with other people.  Always practice safe sex.  Contact a health care provider if:  You have symptoms of a viral illness that do not go away.  Your symptoms come back after going away.  Your symptoms get worse. Get help right away if:  You have trouble breathing.  You have a severe headache or a stiff neck.  You have severe vomiting or abdominal pain. This information is not intended to replace advice given to you by your health care provider. Make sure you discuss any questions you have with your health care provider. Document Released: 12/03/2015 Document Revised: 01/05/2016 Document Reviewed: 12/03/2015 Elsevier Interactive Patient Education  2018 [employer]  

## 2018-04-22 ENCOUNTER — Encounter: Payer: Self-pay | Admitting: Family Medicine

## 2019-01-07 ENCOUNTER — Other Ambulatory Visit: Payer: Self-pay | Admitting: Obstetrics and Gynecology

## 2019-01-07 DIAGNOSIS — Z1231 Encounter for screening mammogram for malignant neoplasm of breast: Secondary | ICD-10-CM

## 2019-02-24 ENCOUNTER — Other Ambulatory Visit: Payer: Self-pay

## 2019-02-24 ENCOUNTER — Ambulatory Visit
Admission: RE | Admit: 2019-02-24 | Discharge: 2019-02-24 | Disposition: A | Discharge status: Home / Self Care | Payer: BC Managed Care – PPO | Source: Ambulatory Visit | Attending: Obstetrics and Gynecology | Admitting: Obstetrics and Gynecology

## 2019-02-24 DIAGNOSIS — Z1231 Encounter for screening mammogram for malignant neoplasm of breast: Secondary | ICD-10-CM

## 2019-04-07 ENCOUNTER — Ambulatory Visit (INDEPENDENT_AMBULATORY_CARE_PROVIDER_SITE_OTHER): Payer: BC Managed Care – PPO | Admitting: Family Medicine

## 2019-04-07 ENCOUNTER — Other Ambulatory Visit: Payer: Self-pay

## 2019-04-07 ENCOUNTER — Encounter: Payer: Self-pay | Admitting: Family Medicine

## 2019-04-07 VITALS — BP 128/80 | HR 67 | Temp 97.5°F | Ht 67.0 in | Wt 172.5 lb

## 2019-04-07 DIAGNOSIS — Z0000 Encounter for general adult medical examination without abnormal findings: Secondary | ICD-10-CM | POA: Diagnosis not present

## 2019-04-07 DIAGNOSIS — Z23 Encounter for immunization: Secondary | ICD-10-CM | POA: Diagnosis not present

## 2019-04-07 LAB — CBC WITH DIFFERENTIAL/PLATELET
Basophils Absolute: 0 10*3/uL (ref 0.0–0.1)
Basophils Relative: 0.5 % (ref 0.0–3.0)
Eosinophils Absolute: 0.1 10*3/uL (ref 0.0–0.7)
Eosinophils Relative: 1.4 % (ref 0.0–5.0)
HCT: 41.4 % (ref 36.0–46.0)
Hemoglobin: 14 g/dL (ref 12.0–15.0)
Lymphocytes Relative: 36.7 % (ref 12.0–46.0)
Lymphs Abs: 2 10*3/uL (ref 0.7–4.0)
MCHC: 33.7 g/dL (ref 30.0–36.0)
MCV: 87.8 fl (ref 78.0–100.0)
Monocytes Absolute: 0.5 10*3/uL (ref 0.1–1.0)
Monocytes Relative: 9.2 % (ref 3.0–12.0)
Neutro Abs: 2.9 10*3/uL (ref 1.4–7.7)
Neutrophils Relative %: 52.2 % (ref 43.0–77.0)
Platelets: 328 10*3/uL (ref 150.0–400.0)
RBC: 4.72 Mil/uL (ref 3.87–5.11)
RDW: 12.6 % (ref 11.5–15.5)
WBC: 5.5 10*3/uL (ref 4.0–10.5)

## 2019-04-07 LAB — BASIC METABOLIC PANEL
BUN: 16 mg/dL (ref 6–23)
CO2: 27 mEq/L (ref 19–32)
Calcium: 9.4 mg/dL (ref 8.4–10.5)
Chloride: 104 mEq/L (ref 96–112)
Creatinine, Ser: 0.71 mg/dL (ref 0.40–1.20)
GFR: 87.01 mL/min (ref >60.00)
Glucose, Bld: 86 mg/dL (ref 70–99)
Potassium: 4.5 mEq/L (ref 3.5–5.1)
Sodium: 139 mEq/L (ref 135–145)

## 2019-04-07 LAB — LIPID PANEL
Cholesterol: 208 mg/dL — ABNORMAL HIGH (ref 0–200)
HDL: 74.2 mg/dL (ref >39.00)
LDL Cholesterol: 112 mg/dL — ABNORMAL HIGH (ref 0–99)
NonHDL: 133.32
Total CHOL/HDL Ratio: 3
Triglycerides: 106 mg/dL (ref 0.0–149.0)
VLDL: 21.2 mg/dL (ref 0.0–40.0)

## 2019-04-07 LAB — HEPATIC FUNCTION PANEL
ALT: 14 U/L (ref 0–35)
AST: 21 U/L (ref 0–37)
Albumin: 4.6 g/dL (ref 3.5–5.2)
Alkaline Phosphatase: 63 U/L (ref 39–117)
Bilirubin, Direct: 0.1 mg/dL (ref 0.0–0.3)
Total Bilirubin: 0.5 mg/dL (ref 0.2–1.2)
Total Protein: 6.9 g/dL (ref 6.0–8.3)

## 2019-04-07 LAB — TSH: TSH: 1.31 u[IU]/mL (ref 0.35–4.50)

## 2019-04-07 NOTE — Progress Notes (Signed)
Subjective:     Patient ID: Debbie Velez, female   DOB: 09/18/1970, 48 y.o.   MRN: [id number]  HPI   Patient seen for physical exam.  She sees gynecologist regularly.  She had a bout this spring with some depression symptoms and was briefly on Paxil.  She did not tolerate the medication well and tapered off and is doing much better at this time.  She has been exercising regularly.   She just turned 48 this year.   Hass never had screening colonoscopy.  She is asking to wait at this time- possibly until this Spring.  She needs flu vaccine.  Tetanus due next year.  She had recent mammogram last month.  She gets yearly Pap smears.  Her insurance company is requiring a cotinine level.  She has never smoked.  No history of shingles vaccine  Past Medical History:  Diagnosis Date  . DYSPNEA 01/24/2010   Past Surgical History:  Procedure Laterality Date  . abdomnaplasty  2004  . CESAREAN SECTION     1993, 1997  . IRRIGATION AND DEBRIDEMENT HEMATOMA     infected abdominal wall surgery 2008  . MANDIBLE SURGERY  2001    reports that she has never smoked. She has never used smokeless tobacco. She reports current alcohol use. She reports that she does not use drugs. family history includes Alcohol abuse in her father and sister; Cancer in her father; Diabetes in her mother. Allergies  Allergen Reactions  . Ciprofloxacin Itching  . Pyridium [Phenazopyridine] Swelling     Review of Systems  Constitutional: Negative for activity change, appetite change, fatigue, fever and unexpected weight change.  HENT: Negative for ear pain, hearing loss, sore throat and trouble swallowing.   Eyes: Negative for visual disturbance.  Respiratory: Negative for cough and shortness of breath.   Cardiovascular: Negative for chest pain and palpitations.  Gastrointestinal: Negative for abdominal pain, blood in stool, constipation and diarrhea.  Genitourinary: Negative for dysuria and hematuria.   Musculoskeletal: Negative for arthralgias, back pain and myalgias.  Skin: Negative for rash.  Neurological: Negative for dizziness, syncope and headaches.  Hematological: Negative for adenopathy.  Psychiatric/Behavioral: Negative for confusion and dysphoric mood.       Objective:   Physical Exam Constitutional:      Appearance: She is well-developed.  HENT:     Head: Normocephalic and atraumatic.  Eyes:     Pupils: Pupils are equal, round, and reactive to light.  Neck:     Musculoskeletal: Normal range of motion and neck supple.     Thyroid: No thyromegaly.  Cardiovascular:     Rate and Rhythm: Normal rate and regular rhythm.     Heart sounds: Normal heart sounds. No murmur.  Pulmonary:     Effort: No respiratory distress.     Breath sounds: Normal breath sounds. No wheezing or rales.  Abdominal:     General: Bowel sounds are normal. There is no distension.     Palpations: Abdomen is soft. There is no mass.     Tenderness: There is no abdominal tenderness. There is no guarding or rebound.  Musculoskeletal: Normal range of motion.  Lymphadenopathy:     Cervical: No cervical adenopathy.  Skin:    Findings: No rash.  Neurological:     Mental Status: She is alert and oriented to person, place, and time.     Cranial Nerves: No cranial nerve deficit.     Deep Tendon Reflexes: Reflexes normal.  Psychiatric:  Behavior: Behavior normal.        Thought Content: Thought content normal.        Judgment: Judgment normal.        Assessment:     Physical exam.  Generally healthy 48 year old female.  We discussed the following health maintenance issues    Plan:     -Flu vaccine recommended and given -Discussed shingles vaccine and she will check on insurance coverage -Discussed screening colonoscopy.  She wishes to defer this a few months and will contact me within the next 6 months or so to schedule -Obtain screening lab work.  Will include cotinine level based on her  insurance requirements  .Eulas Post MD [facility] at [facility]

## 2019-04-12 LAB — NICOTINE/COTININE METABOLITES
Cotinine: <1 ng/mL
Nicotine: <1 ng/mL

## 2019-04-14 ENCOUNTER — Encounter: Payer: Self-pay | Admitting: Family Medicine

## 2019-09-01 ENCOUNTER — Ambulatory Visit (INDEPENDENT_AMBULATORY_CARE_PROVIDER_SITE_OTHER): Payer: BC Managed Care – PPO | Admitting: *Deleted

## 2019-09-01 ENCOUNTER — Other Ambulatory Visit: Payer: Self-pay

## 2019-09-01 DIAGNOSIS — Z23 Encounter for immunization: Secondary | ICD-10-CM | POA: Diagnosis not present

## 2019-09-01 NOTE — Progress Notes (Signed)
Per orders of Dr. Burchette, injection of 2nd Shingles Vaccine given by Burnis Kaser S Tyshae Stair. Patient tolerated injection well. 

## 2020-01-23 ENCOUNTER — Other Ambulatory Visit: Payer: Self-pay | Admitting: Obstetrics and Gynecology

## 2020-01-23 DIAGNOSIS — Z1231 Encounter for screening mammogram for malignant neoplasm of breast: Secondary | ICD-10-CM

## 2020-02-25 ENCOUNTER — Other Ambulatory Visit: Payer: Self-pay

## 2020-02-25 ENCOUNTER — Ambulatory Visit
Admission: RE | Admit: 2020-02-25 | Discharge: 2020-02-25 | Disposition: A | Discharge status: Home / Self Care | Payer: BC Managed Care – PPO | Source: Ambulatory Visit | Attending: Obstetrics and Gynecology | Admitting: Obstetrics and Gynecology

## 2020-02-25 DIAGNOSIS — Z1231 Encounter for screening mammogram for malignant neoplasm of breast: Secondary | ICD-10-CM

## 2020-04-21 ENCOUNTER — Encounter: Payer: BC Managed Care – PPO | Admitting: Family Medicine

## 2020-05-07 ENCOUNTER — Encounter: Payer: BC Managed Care – PPO | Admitting: Family Medicine

## 2020-05-14 ENCOUNTER — Ambulatory Visit (INDEPENDENT_AMBULATORY_CARE_PROVIDER_SITE_OTHER): Payer: BC Managed Care – PPO | Admitting: Family Medicine

## 2020-05-14 ENCOUNTER — Other Ambulatory Visit: Payer: Self-pay

## 2020-05-14 ENCOUNTER — Encounter: Payer: Self-pay | Admitting: Family Medicine

## 2020-05-14 VITALS — BP 128/78 | HR 67 | Temp 98.1°F | Ht 67.0 in | Wt 175.4 lb

## 2020-05-14 DIAGNOSIS — Z23 Encounter for immunization: Secondary | ICD-10-CM | POA: Diagnosis not present

## 2020-05-14 DIAGNOSIS — Z0000 Encounter for general adult medical examination without abnormal findings: Secondary | ICD-10-CM

## 2020-05-14 DIAGNOSIS — H18513 Endothelial corneal dystrophy, bilateral: Secondary | ICD-10-CM | POA: Diagnosis not present

## 2020-05-14 NOTE — Patient Instructions (Signed)

## 2020-05-14 NOTE — Progress Notes (Signed)
Established Patient Office Visit  Subjective:  Patient ID: Debbie Velez, female    DOB: 1971-01-08  Age: 49 y.o. MRN: [id number]  CC:  Chief Complaint  Patient presents with  . Annual Exam    HPI SENIE LANESE presents for physical exam. 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 Generally very healthy. She exercises regularly. She sees GYN. She has not had a colonoscopy since turning 48. She also needs flu vaccine and Tdap. Other vaccines up-to-date. She had Shingrix vaccine last year. No history of hepatitis C screening but low risk. Her insurance is requiring cotinine level. She has never smoked.  Past Medical History:  Diagnosis Date  . DYSPNEA 01/24/2010   Past Surgical History:  Procedure Laterality Date  . abdomnaplasty  2004  . CESAREAN SECTION     1993, 1997  . IRRIGATION AND DEBRIDEMENT HEMATOMA     infected abdominal wall surgery 2008  . MANDIBLE SURGERY  2001    reports that she has never smoked. She has never used smokeless tobacco. She reports current alcohol use. She reports that she does not use drugs. family history includes Alcohol abuse in her father and sister; Cancer in her father; Diabetes in her mother. Allergies  Allergen Reactions  . Ciprofloxacin Itching  . Pyridium [Phenazopyridine] Swelling     Past Medical History:  Diagnosis Date  . DYSPNEA 01/24/2010    Past Surgical History:  Procedure Laterality Date  . abdomnaplasty  2004  . CESAREAN SECTION     1993, 1997  . IRRIGATION AND DEBRIDEMENT HEMATOMA     infected abdominal wall surgery 2008  . MANDIBLE SURGERY  2001    Family History  Problem Relation Age of Onset  . Diabetes Mother        type ll  . Alcohol abuse Father   . Cancer Father   . Alcohol abuse Sister     Social History   Socioeconomic History  . Marital status: Married    Spouse name: Not on file  . Number of children: Not on file  . Years  of education: Not on file  . Highest education level: Not on file  Occupational History  . Not on file  Tobacco Use  . Smoking status: Never Smoker  . Smokeless tobacco: Never Used  Vaping Use  . Vaping Use: Never used  Substance and Sexual Activity  . Alcohol use: Yes  . Drug use: No  . Sexual activity: Not on file  Other Topics Concern  . Not on file  Social History Narrative  . Not on file   Social Determinants of Health   Financial Resource Strain:   . Difficulty of Paying Living Expenses: Not on file  Food Insecurity:   . Worried About Programme researcher, broadcasting/film/video in the Last Year: Not on file  . Ran Out of Food in the Last Year: Not on file  Transportation Needs:   . Lack of Transportation (Medical): Not on file  . Lack of Transportation (Non-Medical): Not on file  Physical Activity:   . Days of Exercise per Week: Not on file  . Minutes of Exercise per Session: Not on file  Stress:   . Feeling of Stress : Not on file  Social Connections:   . Frequency of Communication with Friends and Family: Not on file  . Frequency of Social Gatherings with Friends and Family: Not on file  . Attends  Religious Services: Not on file  . Active Member of Clubs or Organizations: Not on file  . Attends Banker Meetings: Not on file  . Marital Status: Not on file  Intimate Partner Violence:   . Fear of Current or Ex-Partner: Not on file  . Emotionally Abused: Not on file  . Physically Abused: Not on file  . Sexually Abused: Not on file    Outpatient Medications Prior to Visit  Medication Sig Dispense Refill  . clindamycin (CLEOCIN T) 1 % lotion     . LORazepam (ATIVAN) 0.5 MG tablet     . prednisoLONE acetate (PREDNISOLONE ACETATE P-F) 1 % ophthalmic suspension 1 drop 4 (four) times daily.    Marland Kitchen tretinoin (RETIN-A) 0.025 % cream Apply topically at bedtime.    Marland Kitchen ibuprofen (ADVIL,MOTRIN) 200 MG tablet Take 600 mg by mouth every 6 (six) hours as needed for mild pain.    .  metroNIDAZOLE (METROCREAM) 0.75 % cream     . PREMARIN vaginal cream PLEASE SEE ATTACHED FOR DETAILED DIRECTIONS     No facility-administered medications prior to visit.    Allergies  Allergen Reactions  . Ciprofloxacin Itching  . Pyridium [Phenazopyridine] Swelling    ROS Review of Systems  Constitutional: Negative for activity change, appetite change, fatigue, fever and unexpected weight change.  HENT: Negative for ear pain, hearing loss, sore throat and trouble swallowing.   Eyes: Negative for visual disturbance.  Respiratory: Negative for cough and shortness of breath.   Cardiovascular: Negative for chest pain and palpitations.  Gastrointestinal: Negative for abdominal pain, blood in stool, constipation and diarrhea.  Genitourinary: Negative for dysuria and hematuria.  Musculoskeletal: Negative for arthralgias, back pain and myalgias.  Skin: Negative for rash.  Neurological: Negative for dizziness, syncope and headaches.  Hematological: Negative for adenopathy.  Psychiatric/Behavioral: Negative for confusion and dysphoric mood.      Objective:    Physical Exam Constitutional:      Appearance: She is well-developed.  HENT:     Head: Normocephalic and atraumatic.     Mouth/Throat:     Mouth: Mucous membranes are moist.     Pharynx: Oropharynx is clear. No posterior oropharyngeal erythema.  Eyes:     Pupils: Pupils are equal, round, and reactive to light.  Neck:     Thyroid: No thyromegaly.  Cardiovascular:     Rate and Rhythm: Normal rate and regular rhythm.     Heart sounds: Normal heart sounds. No murmur heard.   Pulmonary:     Effort: No respiratory distress.     Breath sounds: Normal breath sounds. No wheezing or rales.  Abdominal:     General: Bowel sounds are normal. There is no distension.     Palpations: Abdomen is soft. There is no mass.     Tenderness: There is no abdominal tenderness. There is no guarding or rebound.  Musculoskeletal:         General: Normal range of motion.     Cervical back: Normal range of motion and neck supple.     Right lower leg: No edema.     Left lower leg: No edema.  Lymphadenopathy:     Cervical: No cervical adenopathy.  Skin:    Findings: No rash.  Neurological:     Mental Status: She is alert and oriented to person, place, and time.     Cranial Nerves: No cranial nerve deficit.     Deep Tendon Reflexes: Reflexes normal.  Psychiatric:  Behavior: Behavior normal.        Thought Content: Thought content normal.        Judgment: Judgment normal.     BP 128/78 (BP Location: Left Arm, Patient Position: Sitting, Cuff Size: Normal)   Pulse 67   Temp 98.1 F (36.7 C) (Oral)   Ht 5\' 7"  (1.702 m)   Wt 175 lb 6.4 oz (79.6 kg)   SpO2 98%   BMI 27.47 kg/m  Wt Readings from Last 3 Encounters:  05/14/20 175 lb 6.4 oz (79.6 kg)  04/07/19 172 lb 8 oz (78.2 kg)  04/04/18 173 lb 11.2 oz (78.8 kg)     Health Maintenance Due  Topic Date Due  . Hepatitis C Screening  Never done  . COLONOSCOPY  Never done  . INFLUENZA VACCINE  03/07/2020  . TETANUS/TDAP  03/24/2020    There are no preventive care reminders to display for this patient.  Lab Results  Component Value Date   TSH 1.31 04/07/2019   Lab Results  Component Value Date   WBC 5.5 04/07/2019   HGB 14.0 04/07/2019   HCT 41.4 04/07/2019   MCV 87.8 04/07/2019   PLT 328.0 04/07/2019   Lab Results  Component Value Date   NA 139 04/07/2019   K 4.5 04/07/2019   CO2 27 04/07/2019   GLUCOSE 86 04/07/2019   BUN 16 04/07/2019   CREATININE 0.71 04/07/2019   BILITOT 0.5 04/07/2019   ALKPHOS 63 04/07/2019   AST 21 04/07/2019   ALT 14 04/07/2019   PROT 6.9 04/07/2019   ALBUMIN 4.6 04/07/2019   CALCIUM 9.4 04/07/2019   ANIONGAP 8 02/14/2018   GFR 87.01 04/07/2019   Lab Results  Component Value Date   CHOL 208 (H) 04/07/2019   Lab Results  Component Value Date   HDL 74.20 04/07/2019   Lab Results  Component Value Date    LDLCALC 112 (H) 04/07/2019   Lab Results  Component Value Date   TRIG 106.0 04/07/2019   Lab Results  Component Value Date   CHOLHDL 3 04/07/2019   No results found for: HGBA1C    Assessment & Plan:   Problem List Items Addressed This Visit    None    Visit Diagnoses    Physical exam    -  Primary   Relevant Orders   NICOTINE/COTININE SP   Ambulatory referral to Gastroenterology   Basic metabolic panel   Lipid panel   CBC with Differential/Platelet   TSH   Hepatic function panel   Hep C Antibody    Healthy 49 year old female.  -Recommend tetanus vaccine and flu vaccine. -Set up colonoscopy -Continue regular exercise habits -Check screening labs including hepatitis C antibody. She is low risk for hepatitis C. -Check cotinine level based on insurance requirements -Continue regular GYN follow-up  No orders of the defined types were placed in this encounter.   Follow-up: No follow-ups on file.    44, MD

## 2020-05-18 LAB — LIPID PANEL
Cholesterol: 189 mg/dL (ref <200)
HDL: 70 mg/dL (ref >=50)
LDL Cholesterol (Calc): 102 mg/dL (calc) — ABNORMAL HIGH
Non-HDL Cholesterol (Calc): 119 mg/dL (calc) (ref <130)
Total CHOL/HDL Ratio: 2.7 (calc) (ref <5.0)
Triglycerides: 83 mg/dL (ref <150)

## 2020-05-18 LAB — CBC WITH DIFFERENTIAL/PLATELET
Absolute Monocytes: 586 cells/uL (ref 200–950)
Basophils Absolute: 32 cells/uL (ref 0–200)
Basophils Relative: 0.5 %
Eosinophils Absolute: 120 cells/uL (ref 15–500)
Eosinophils Relative: 1.9 %
HCT: 43.8 % (ref 35.0–45.0)
Hemoglobin: 14.5 g/dL (ref 11.7–15.5)
Lymphs Abs: 2016 cells/uL (ref 850–3900)
MCH: 28.9 pg (ref 27.0–33.0)
MCHC: 33.1 g/dL (ref 32.0–36.0)
MCV: 87.4 fL (ref 80.0–100.0)
MPV: 9.3 fL (ref 7.5–12.5)
Monocytes Relative: 9.3 %
Neutro Abs: 3547 cells/uL (ref 1500–7800)
Neutrophils Relative %: 56.3 %
Platelets: 342 10*3/uL (ref 140–400)
RBC: 5.01 10*6/uL (ref 3.80–5.10)
RDW: 12.3 % (ref 11.0–15.0)
Total Lymphocyte: 32 %
WBC: 6.3 10*3/uL (ref 3.8–10.8)

## 2020-05-18 LAB — BASIC METABOLIC PANEL
BUN: 19 mg/dL (ref 7–25)
CO2: 25 mmol/L (ref 20–32)
Calcium: 9.8 mg/dL (ref 8.6–10.4)
Chloride: 104 mmol/L (ref 98–110)
Creat: 0.82 mg/dL (ref 0.50–1.05)
Glucose, Bld: 88 mg/dL (ref 65–99)
Potassium: 4.2 mmol/L (ref 3.5–5.3)
Sodium: 139 mmol/L (ref 135–146)

## 2020-05-18 LAB — NICOTINE/COTININE SP
Cotinine: <2 ng/mL
Nicotine screen: <2 ng/mL

## 2020-05-18 LAB — HEPATIC FUNCTION PANEL
AG Ratio: 1.8 (calc) (ref 1.0–2.5)
ALT: 13 U/L (ref 6–29)
AST: 23 U/L (ref 10–35)
Albumin: 4.6 g/dL (ref 3.6–5.1)
Alkaline phosphatase (APISO): 64 U/L (ref 37–153)
Bilirubin, Direct: 0.1 mg/dL (ref 0.0–0.2)
Globulin: 2.6 g/dL (calc) (ref 1.9–3.7)
Indirect Bilirubin: 0.4 mg/dL (calc) (ref 0.2–1.2)
Total Bilirubin: 0.5 mg/dL (ref 0.2–1.2)
Total Protein: 7.2 g/dL (ref 6.1–8.1)

## 2020-05-18 LAB — HEPATITIS C ANTIBODY
Hepatitis C Ab: NONREACTIVE
SIGNAL TO CUT-OFF: 0.02 (ref <1.00)

## 2020-05-18 LAB — TSH: TSH: 1.93 mIU/L

## 2020-05-28 ENCOUNTER — Encounter: Payer: BC Managed Care – PPO | Admitting: Family Medicine

## 2020-11-28 ENCOUNTER — Encounter: Payer: Self-pay | Admitting: Family Medicine

## 2020-11-28 DIAGNOSIS — Z1211 Encounter for screening for malignant neoplasm of colon: Secondary | ICD-10-CM

## 2020-11-29 NOTE — Telephone Encounter (Signed)
Please advise. What Dx are we using?

## 2020-12-13 ENCOUNTER — Other Ambulatory Visit: Payer: Self-pay | Admitting: Obstetrics and Gynecology

## 2020-12-13 DIAGNOSIS — Z1231 Encounter for screening mammogram for malignant neoplasm of breast: Secondary | ICD-10-CM

## 2021-02-28 ENCOUNTER — Ambulatory Visit: Payer: BC Managed Care – PPO

## 2021-03-02 ENCOUNTER — Other Ambulatory Visit: Payer: Self-pay

## 2021-03-02 ENCOUNTER — Ambulatory Visit
Admission: RE | Admit: 2021-03-02 | Discharge: 2021-03-02 | Disposition: A | Discharge status: Home / Self Care | Payer: BC Managed Care – PPO | Source: Ambulatory Visit | Attending: Obstetrics and Gynecology | Admitting: Obstetrics and Gynecology

## 2021-03-02 DIAGNOSIS — Z1231 Encounter for screening mammogram for malignant neoplasm of breast: Secondary | ICD-10-CM

## 2021-03-23 ENCOUNTER — Encounter: Payer: Self-pay | Admitting: Family Medicine

## 2021-05-16 ENCOUNTER — Other Ambulatory Visit: Payer: Self-pay

## 2021-05-16 ENCOUNTER — Ambulatory Visit (INDEPENDENT_AMBULATORY_CARE_PROVIDER_SITE_OTHER): Payer: BC Managed Care – PPO | Admitting: Family Medicine

## 2021-05-16 ENCOUNTER — Encounter: Payer: Self-pay | Admitting: Family Medicine

## 2021-05-16 VITALS — BP 128/88 | HR 73 | Temp 97.6°F | Ht 67.0 in | Wt 170.7 lb

## 2021-05-16 DIAGNOSIS — Z23 Encounter for immunization: Secondary | ICD-10-CM

## 2021-05-16 DIAGNOSIS — Z0000 Encounter for general adult medical examination without abnormal findings: Secondary | ICD-10-CM | POA: Diagnosis not present

## 2021-05-16 LAB — LIPID PANEL
Cholesterol: 185 mg/dL (ref 0–200)
HDL: 73.7 mg/dL (ref >39.00)
LDL Cholesterol: 92 mg/dL (ref 0–99)
NonHDL: 111.39
Total CHOL/HDL Ratio: 3
Triglycerides: 99 mg/dL (ref 0.0–149.0)
VLDL: 19.8 mg/dL (ref 0.0–40.0)

## 2021-05-16 LAB — CBC WITH DIFFERENTIAL/PLATELET
Basophils Absolute: 0 10*3/uL (ref 0.0–0.1)
Basophils Relative: 0.5 % (ref 0.0–3.0)
Eosinophils Absolute: 0.1 10*3/uL (ref 0.0–0.7)
Eosinophils Relative: 1.8 % (ref 0.0–5.0)
HCT: 40.9 % (ref 36.0–46.0)
Hemoglobin: 13.7 g/dL (ref 12.0–15.0)
Lymphocytes Relative: 29 % (ref 12.0–46.0)
Lymphs Abs: 1.5 10*3/uL (ref 0.7–4.0)
MCHC: 33.5 g/dL (ref 30.0–36.0)
MCV: 87 fl (ref 78.0–100.0)
Monocytes Absolute: 0.5 10*3/uL (ref 0.1–1.0)
Monocytes Relative: 8.9 % (ref 3.0–12.0)
Neutro Abs: 3.1 10*3/uL (ref 1.4–7.7)
Neutrophils Relative %: 59.8 % (ref 43.0–77.0)
Platelets: 306 10*3/uL (ref 150.0–400.0)
RBC: 4.7 Mil/uL (ref 3.87–5.11)
RDW: 12.9 % (ref 11.5–15.5)
WBC: 5.2 10*3/uL (ref 4.0–10.5)

## 2021-05-16 LAB — HEPATIC FUNCTION PANEL
ALT: 10 U/L (ref 0–35)
AST: 21 U/L (ref 0–37)
Albumin: 4.6 g/dL (ref 3.5–5.2)
Alkaline Phosphatase: 66 U/L (ref 39–117)
Bilirubin, Direct: 0.1 mg/dL (ref 0.0–0.3)
Total Bilirubin: 0.5 mg/dL (ref 0.2–1.2)
Total Protein: 7.3 g/dL (ref 6.0–8.3)

## 2021-05-16 LAB — BASIC METABOLIC PANEL
BUN: 15 mg/dL (ref 6–23)
CO2: 28 mEq/L (ref 19–32)
Calcium: 9.4 mg/dL (ref 8.4–10.5)
Chloride: 104 mEq/L (ref 96–112)
Creatinine, Ser: 0.78 mg/dL (ref 0.40–1.20)
GFR: 87.3 mL/min (ref >60.00)
Glucose, Bld: 99 mg/dL (ref 70–99)
Potassium: 4.1 mEq/L (ref 3.5–5.1)
Sodium: 140 mEq/L (ref 135–145)

## 2021-05-16 LAB — TSH: TSH: 1.75 u[IU]/mL (ref 0.35–5.50)

## 2021-05-16 NOTE — Progress Notes (Signed)
Established Patient Office Visit  Subjective:  Patient ID: Debbie Velez, female    DOB: August 26, 1970  Age: 50 y.o. MRN: [id number]  CC:  Chief Complaint  Patient presents with   Annual Exam    HPI [name] presents for physical exam.  Generally very healthy.  She exercises regularly.  She has colonoscopy scheduled for January 4.  Has been enjoying her 54-month-old grandchild.  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  She has a sister with history of alcohol abuse.  Her father had alcohol abuse and probable alcoholic cirrhosis and may have had some sort of cancer.  He apparently also had some heart disease.  Mother has type 2 diabetes.  Social history-never smoked.  She has 2 children and 1 grandchild.  Exercises regularly.  She is married.  Past Medical History:  Diagnosis Date   DYSPNEA 01/24/2010    Past Surgical History:  Procedure Laterality Date   abdomnaplasty  2004   CESAREAN SECTION     1993, 1997   IRRIGATION AND DEBRIDEMENT HEMATOMA     infected abdominal wall surgery 2008   MANDIBLE SURGERY  2001    Family History  Problem Relation Age of Onset   Diabetes Mother        type ll   Heart disease Father    Alcohol abuse Father    Cancer Father    Alcohol abuse Sister     Social History   Socioeconomic History   Marital status: Married    Spouse name: Not on file   Number of children: Not on file   Years of education: Not on file   Highest education level: Not on file  Occupational History   Not on file  Tobacco Use   Smoking status: Never   Smokeless tobacco: Never  Vaping Use   Vaping Use: Never used  Substance and Sexual Activity   Alcohol  use: Yes   Drug use: No   Sexual activity: Not on file  Other Topics Concern   Not on file  Social History Narrative   Not on file   Social Determinants of Health   Financial Resource Strain: Not on file  Food Insecurity: Not on file  Transportation Needs: Not on file  Physical Activity: Not on file  Stress: Not on file  Social Connections: Not on file  Intimate Partner Violence: Not on file    Outpatient Medications Prior to Visit  Medication Sig Dispense Refill   clindamycin (CLEOCIN T) 1 % lotion      LORazepam (ATIVAN) 0.5 MG tablet      metroNIDAZOLE (METROCREAM) 0.75 % cream      prednisoLONE acetate (PRED FORTE) 1 % ophthalmic suspension 1 drop 4 (four) times daily.     ibuprofen (ADVIL,MOTRIN) 200 MG tablet Take 600 mg by mouth every 6 (six) hours as needed for mild pain.     PREMARIN vaginal cream PLEASE SEE ATTACHED FOR DETAILED DIRECTIONS     tretinoin (RETIN-A) 0.025 % cream Apply topically at bedtime.     No facility-administered medications prior to visit.    Allergies  Allergen Reactions   Ciprofloxacin Itching   Pyridium [Phenazopyridine] Swelling    ROS Review of Systems  Constitutional:  Negative for activity change, appetite change, fatigue, fever and unexpected weight change.  HENT:  Negative for ear pain, hearing loss, sore throat and trouble swallowing.   Eyes:  Negative for visual disturbance.  Respiratory:  Negative for cough and shortness of breath.   Cardiovascular:  Negative for chest pain and palpitations.  Gastrointestinal:  Negative for abdominal pain, blood in stool, constipation and diarrhea.  Endocrine: Negative for polydipsia and polyuria.  Genitourinary:  Negative for dysuria and hematuria.  Musculoskeletal:  Negative for arthralgias, back pain and myalgias.  Skin:  Negative for rash.  Neurological:  Negative for dizziness, syncope and headaches.  Hematological:  Negative for adenopathy.  Psychiatric/Behavioral:  Negative for  confusion and dysphoric mood.      Objective:    Physical Exam Constitutional:      Appearance: She is well-developed.  HENT:     Head: Normocephalic and atraumatic.  Eyes:     Pupils: Pupils are equal, round, and reactive to light.  Neck:     Thyroid: No thyromegaly.  Cardiovascular:     Rate and Rhythm: Normal rate and regular rhythm.     Heart sounds: Normal heart sounds. No murmur heard. Pulmonary:     Effort: No respiratory distress.     Breath sounds: Normal breath sounds. No wheezing or rales.  Abdominal:     General: Bowel sounds are normal. There is no distension.     Palpations: Abdomen is soft. There is no mass.     Tenderness: There is no abdominal tenderness. There is no guarding or rebound.  Musculoskeletal:        General: Normal range of motion.     Cervical back: Normal range of motion and neck supple.     Right lower leg: No edema.     Left lower leg: No edema.  Lymphadenopathy:     Cervical: No cervical adenopathy.  Skin:    Findings: No rash.  Neurological:     Mental Status: She is alert and oriented to person, place, and time.     Cranial Nerves: No cranial nerve deficit.  Psychiatric:        Behavior: Behavior normal.        Thought Content: Thought content normal.        Judgment: Judgment normal.    BP 128/88 (BP Location: Left Arm, Cuff Size: Normal)   Pulse 73   Temp 97.6 F (36.4 C) (Oral)   Ht 5\' 7"  (1.702 m)   Wt 170 lb 11.2 oz (77.4 kg)   SpO2 100%   BMI 26.74 kg/m  Wt Readings from Last 3 Encounters:  05/16/21 170 lb 11.2 oz (77.4 kg)  05/14/20 175 lb 6.4 oz (79.6 kg)  04/07/19 172 lb 8 oz (78.2 kg)     Health Maintenance Due  Topic Date Due   COLONOSCOPY (Pts 45-77yrs Insurance coverage will need to be confirmed)  Never done    There are no preventive care reminders to display for this patient.  Lab Results  Component Value Date   TSH 1.93 05/14/2020   Lab Results  Component Value Date   WBC 6.3 05/14/2020   HGB  14.5 05/14/2020   HCT 43.8 05/14/2020   MCV 87.4 05/14/2020   PLT 342 05/14/2020   Lab Results  Component Value Date   NA 139 05/14/2020   K 4.2 05/14/2020   CO2 25 05/14/2020   GLUCOSE 88 05/14/2020   BUN 19 05/14/2020   CREATININE 0.82 05/14/2020  BILITOT 0.5 05/14/2020   ALKPHOS 63 04/07/2019   AST 23 05/14/2020   ALT 13 05/14/2020   PROT 7.2 05/14/2020   ALBUMIN 4.6 04/07/2019   CALCIUM 9.8 05/14/2020   ANIONGAP 8 02/14/2018   GFR 87.01 04/07/2019   Lab Results  Component Value Date   CHOL 189 05/14/2020   Lab Results  Component Value Date   HDL 70 05/14/2020   Lab Results  Component Value Date   LDLCALC 102 (H) 05/14/2020   Lab Results  Component Value Date   TRIG 83 05/14/2020   Lab Results  Component Value Date   CHOLHDL 2.7 05/14/2020   No results found for: HGBA1C    Assessment & Plan:   Problem List Items Addressed This Visit   None Visit Diagnoses     Physical exam    -  Primary   Relevant Orders   Basic metabolic panel   Lipid panel   CBC with Differential/Platelet   TSH   Hepatic function panel   Need for immunization against influenza       Relevant Orders   Flu Vaccine QUAD 38mo+IM (Fluarix, Fluzone & Alfiuria Quad PF) (Completed)     -Flu vaccine given -Colonoscopy is already scheduled for January 4 -Continue regular weightbearing exercises -Labs as above -She is continue with GYN follow-up for her Pap smears and mammograms. -We did recommend she monitor blood pressures occasionally at home.  Repeat reading today 128/88  No orders of the defined types were placed in this encounter.   Follow-up: No follow-ups on file.    Evelena Peat, MD

## 2021-09-30 DIAGNOSIS — L821 Other seborrheic keratosis: Secondary | ICD-10-CM | POA: Diagnosis not present

## 2021-09-30 DIAGNOSIS — D2261 Melanocytic nevi of right upper limb, including shoulder: Secondary | ICD-10-CM | POA: Diagnosis not present

## 2021-09-30 DIAGNOSIS — D2262 Melanocytic nevi of left upper limb, including shoulder: Secondary | ICD-10-CM | POA: Diagnosis not present

## 2021-09-30 DIAGNOSIS — L720 Epidermal cyst: Secondary | ICD-10-CM | POA: Diagnosis not present

## 2021-10-07 DIAGNOSIS — D123 Benign neoplasm of transverse colon: Secondary | ICD-10-CM | POA: Diagnosis not present

## 2021-10-07 DIAGNOSIS — K635 Polyp of colon: Secondary | ICD-10-CM | POA: Diagnosis not present

## 2021-10-07 DIAGNOSIS — Z1211 Encounter for screening for malignant neoplasm of colon: Secondary | ICD-10-CM | POA: Diagnosis not present

## 2021-10-10 ENCOUNTER — Other Ambulatory Visit: Payer: Self-pay | Admitting: Gastroenterology

## 2021-10-10 DIAGNOSIS — R933 Abnormal findings on diagnostic imaging of other parts of digestive tract: Secondary | ICD-10-CM

## 2021-10-12 ENCOUNTER — Ambulatory Visit
Admission: RE | Admit: 2021-10-12 | Discharge: 2021-10-12 | Disposition: A | Discharge status: Home / Self Care | Payer: BC Managed Care – PPO | Source: Ambulatory Visit | Attending: Gastroenterology | Admitting: Gastroenterology

## 2021-10-12 DIAGNOSIS — N858 Other specified noninflammatory disorders of uterus: Secondary | ICD-10-CM | POA: Diagnosis not present

## 2021-10-12 DIAGNOSIS — R933 Abnormal findings on diagnostic imaging of other parts of digestive tract: Secondary | ICD-10-CM

## 2021-10-12 DIAGNOSIS — Z780 Asymptomatic menopausal state: Secondary | ICD-10-CM | POA: Diagnosis not present

## 2021-11-02 DIAGNOSIS — R399 Unspecified symptoms and signs involving the genitourinary system: Secondary | ICD-10-CM | POA: Diagnosis not present

## 2021-11-17 DIAGNOSIS — I158 Other secondary hypertension: Secondary | ICD-10-CM | POA: Diagnosis not present

## 2021-12-14 DIAGNOSIS — H11823 Conjunctivochalasis, bilateral: Secondary | ICD-10-CM | POA: Diagnosis not present

## 2021-12-14 DIAGNOSIS — H43393 Other vitreous opacities, bilateral: Secondary | ICD-10-CM | POA: Diagnosis not present

## 2021-12-14 DIAGNOSIS — Z961 Presence of intraocular lens: Secondary | ICD-10-CM | POA: Diagnosis not present

## 2021-12-14 DIAGNOSIS — Z947 Corneal transplant status: Secondary | ICD-10-CM | POA: Diagnosis not present

## 2022-01-17 DIAGNOSIS — Z961 Presence of intraocular lens: Secondary | ICD-10-CM | POA: Diagnosis not present

## 2022-01-17 DIAGNOSIS — Z947 Corneal transplant status: Secondary | ICD-10-CM | POA: Diagnosis not present

## 2022-01-17 DIAGNOSIS — H18513 Endothelial corneal dystrophy, bilateral: Secondary | ICD-10-CM | POA: Diagnosis not present

## 2022-01-17 DIAGNOSIS — H43393 Other vitreous opacities, bilateral: Secondary | ICD-10-CM | POA: Diagnosis not present

## 2022-01-30 DIAGNOSIS — M545 Low back pain, unspecified: Secondary | ICD-10-CM | POA: Diagnosis not present

## 2022-02-08 ENCOUNTER — Other Ambulatory Visit: Payer: Self-pay | Admitting: Obstetrics and Gynecology

## 2022-02-08 DIAGNOSIS — Z1231 Encounter for screening mammogram for malignant neoplasm of breast: Secondary | ICD-10-CM

## 2022-03-20 ENCOUNTER — Ambulatory Visit
Admission: RE | Admit: 2022-03-20 | Discharge: 2022-03-20 | Disposition: A | Discharge status: Home / Self Care | Payer: BC Managed Care – PPO | Source: Ambulatory Visit | Attending: Obstetrics and Gynecology | Admitting: Obstetrics and Gynecology

## 2022-03-20 DIAGNOSIS — Z1231 Encounter for screening mammogram for malignant neoplasm of breast: Secondary | ICD-10-CM | POA: Diagnosis not present

## 2022-03-22 ENCOUNTER — Other Ambulatory Visit: Payer: Self-pay | Admitting: Obstetrics and Gynecology

## 2022-03-22 DIAGNOSIS — R928 Other abnormal and inconclusive findings on diagnostic imaging of breast: Secondary | ICD-10-CM

## 2022-04-07 ENCOUNTER — Ambulatory Visit
Admission: RE | Admit: 2022-04-07 | Discharge: 2022-04-07 | Disposition: A | Discharge status: Home / Self Care | Payer: BC Managed Care – PPO | Source: Ambulatory Visit | Attending: Obstetrics and Gynecology | Admitting: Obstetrics and Gynecology

## 2022-04-07 ENCOUNTER — Other Ambulatory Visit: Payer: Self-pay | Admitting: Obstetrics and Gynecology

## 2022-04-07 DIAGNOSIS — R928 Other abnormal and inconclusive findings on diagnostic imaging of breast: Secondary | ICD-10-CM | POA: Diagnosis not present

## 2022-04-07 DIAGNOSIS — N6320 Unspecified lump in the left breast, unspecified quadrant: Secondary | ICD-10-CM

## 2022-04-07 DIAGNOSIS — N6002 Solitary cyst of left breast: Secondary | ICD-10-CM | POA: Diagnosis not present

## 2022-05-08 DIAGNOSIS — Z124 Encounter for screening for malignant neoplasm of cervix: Secondary | ICD-10-CM | POA: Diagnosis not present

## 2022-05-26 ENCOUNTER — Ambulatory Visit (INDEPENDENT_AMBULATORY_CARE_PROVIDER_SITE_OTHER): Payer: BC Managed Care – PPO | Admitting: Family Medicine

## 2022-05-26 ENCOUNTER — Encounter: Payer: Self-pay | Admitting: Family Medicine

## 2022-05-26 VITALS — BP 130/80 | HR 67 | Temp 97.6°F | Ht 67.32 in | Wt 176.8 lb

## 2022-05-26 DIAGNOSIS — Z23 Encounter for immunization: Secondary | ICD-10-CM | POA: Diagnosis not present

## 2022-05-26 DIAGNOSIS — Z0000 Encounter for general adult medical examination without abnormal findings: Secondary | ICD-10-CM | POA: Diagnosis not present

## 2022-05-26 LAB — BASIC METABOLIC PANEL WITH GFR
BUN: 17 mg/dL (ref 6–23)
CO2: 26 meq/L (ref 19–32)
Calcium: 9.6 mg/dL (ref 8.4–10.5)
Chloride: 104 meq/L (ref 96–112)
Creatinine, Ser: 0.75 mg/dL (ref 0.40–1.20)
GFR: 90.85 mL/min
Glucose, Bld: 97 mg/dL (ref 70–99)
Potassium: 4.3 meq/L (ref 3.5–5.1)
Sodium: 138 meq/L (ref 135–145)

## 2022-05-26 LAB — TSH: TSH: 1.65 u[IU]/mL (ref 0.35–5.50)

## 2022-05-26 LAB — CBC WITH DIFFERENTIAL/PLATELET
Basophils Absolute: 0 10*3/uL (ref 0.0–0.1)
Basophils Relative: 0.8 % (ref 0.0–3.0)
Eosinophils Absolute: 0.2 10*3/uL (ref 0.0–0.7)
Eosinophils Relative: 2.5 % (ref 0.0–5.0)
HCT: 42.5 % (ref 36.0–46.0)
Hemoglobin: 14.2 g/dL (ref 12.0–15.0)
Lymphocytes Relative: 27.2 % (ref 12.0–46.0)
Lymphs Abs: 1.7 10*3/uL (ref 0.7–4.0)
MCHC: 33.4 g/dL (ref 30.0–36.0)
MCV: 87.6 fl (ref 78.0–100.0)
Monocytes Absolute: 0.5 10*3/uL (ref 0.1–1.0)
Monocytes Relative: 8.7 % (ref 3.0–12.0)
Neutro Abs: 3.8 10*3/uL (ref 1.4–7.7)
Neutrophils Relative %: 60.8 % (ref 43.0–77.0)
Platelets: 302 10*3/uL (ref 150.0–400.0)
RBC: 4.85 Mil/uL (ref 3.87–5.11)
RDW: 12.7 % (ref 11.5–15.5)
WBC: 6.2 10*3/uL (ref 4.0–10.5)

## 2022-05-26 LAB — LIPID PANEL
Cholesterol: 193 mg/dL (ref 0–200)
HDL: 68.5 mg/dL
LDL Cholesterol: 109 mg/dL — ABNORMAL HIGH (ref 0–99)
NonHDL: 124.16
Total CHOL/HDL Ratio: 3
Triglycerides: 76 mg/dL (ref 0.0–149.0)
VLDL: 15.2 mg/dL (ref 0.0–40.0)

## 2022-05-26 LAB — HEPATIC FUNCTION PANEL
ALT: 15 U/L (ref 0–35)
AST: 26 U/L (ref 0–37)
Albumin: 4.7 g/dL (ref 3.5–5.2)
Alkaline Phosphatase: 74 U/L (ref 39–117)
Bilirubin, Direct: 0 mg/dL (ref 0.0–0.3)
Total Bilirubin: 0.3 mg/dL (ref 0.2–1.2)
Total Protein: 7.3 g/dL (ref 6.0–8.3)

## 2022-05-26 NOTE — Addendum Note (Signed)
Addended by: Nilda Riggs on: 05/26/2022 08:47 AM   Modules accepted: Orders

## 2022-05-26 NOTE — Progress Notes (Signed)
Established Patient Office Visit  Subjective   Patient ID: Debbie Velez, female    DOB: 08-04-1971  Age: 51 y.o. MRN: [id number]  Chief Complaint  Patient presents with   Annual Exam    HPI   Seen for physical exam.  Generally doing well.  They just had their second grandchild recently.  Both of their children live in the area. She takes no regular medications.  Still sees ophthalmology regularly and also GYN regularly.  She had recent Pap smear and mammogram. Continues to walk about 2-1/2 miles daily.  Health maintenance reviewed  -Need flu vaccine -Had colonoscopy last January which was normal with 10-year follow-up -Pap smear mammogram up-to-date -Shingles already given -Tetanus due 2031  Social history-married with 2 children and 2 grandchildren.  No history of smoking.  No alcohol use.  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  Father died age 61 complications of alcoholic cirrhosis.  Mother apparently has type 2 diabetes.  She had a sister that had alcohol abuse and died of suicide  Past Medical History:  Diagnosis Date   DYSPNEA 01/24/2010   Past Surgical History:  Procedure Laterality Date   abdomnaplasty  2004   [facility]     infected abdominal wall surgery 2008   MANDIBLE SURGERY  2001    reports that she has never smoked. She has never used smokeless tobacco. She reports current alcohol use. She reports that she does not use drugs. family history includes Alcohol abuse in her father and sister; Cancer in her father; Diabetes in her mother; Heart disease in her father; Suicidality in her sister. Allergies  Allergen Reactions   Ciprofloxacin Itching   Pyridium [Phenazopyridine] Swelling    Review of Systems  Constitutional:  Negative for chills, fever, malaise/fatigue and weight loss.  HENT:  Negative for hearing loss.   Eyes:  Negative for  blurred vision and double vision.  Respiratory:  Negative for cough and shortness of breath.   Cardiovascular:  Negative for chest pain, palpitations and leg swelling.  Gastrointestinal:  Negative for abdominal pain, blood in stool, constipation and diarrhea.  Genitourinary:  Negative for dysuria.  Skin:  Negative for rash.  Neurological:  Negative for dizziness, speech change, seizures, loss of consciousness and headaches.  Psychiatric/Behavioral:  Negative for depression.       Objective:     BP 130/80 (BP Location: Left Arm, Patient Position: Sitting, Cuff Size: Normal)   Pulse 67   Temp 97.6 F (36.4 C) (Oral)   Ht 5' 7.32" (1.71 m)   Wt 176 lb 12.8 oz (80.2 kg)   SpO2 98%   BMI 27.43 kg/m  BP Readings from Last 3 Encounters:  05/26/22 130/80  05/16/21 128/88  05/14/20 128/78   Wt Readings from Last 3 Encounters:  05/26/22 176 lb 12.8 oz (80.2 kg)  05/16/21 170 lb 11.2 oz (77.4 kg)  05/14/20 175 lb 6.4 oz (79.6 kg)      Physical Exam Vitals reviewed.  Constitutional:      Appearance: She is well-developed.  HENT:     Head: Normocephalic and atraumatic.  Eyes:     Pupils: Pupils are equal, round, and reactive to light.  Neck:     Thyroid: No thyromegaly.  Cardiovascular:     Rate and Rhythm: Normal rate and regular rhythm.     Heart sounds: Normal heart  sounds. No murmur heard. Pulmonary:     Effort: No respiratory distress.     Breath sounds: Normal breath sounds. No wheezing or rales.  Musculoskeletal:        General: Normal range of motion.     Cervical back: Normal range of motion and neck supple.  Lymphadenopathy:     Cervical: No cervical adenopathy.  Skin:    Findings: No rash.  Neurological:     Mental Status: She is alert and oriented to person, place, and time.     Cranial Nerves: No cranial nerve deficit.  Psychiatric:        Behavior: Behavior normal.        Thought Content: Thought content normal.        Judgment: Judgment normal.       No results found for any visits on 05/26/22.    The 10-year ASCVD risk score (Arnett DK, et al., 2019) is: 1.1%    Assessment & Plan:   Problem List Items Addressed This Visit   None Visit Diagnoses     Physical exam    -  Primary   Relevant Orders   Basic metabolic panel   Lipid panel   CBC with Differential/Platelet   TSH   Hepatic function panel     -Flu vaccine given -Obtain screening labs as above -Continue regular exercise habits. -Continue regular GYN follow-up  No follow-ups on file.    Evelena Peat, MD

## 2022-05-31 ENCOUNTER — Other Ambulatory Visit (HOSPITAL_BASED_OUTPATIENT_CLINIC_OR_DEPARTMENT_OTHER): Payer: Self-pay

## 2022-06-02 ENCOUNTER — Other Ambulatory Visit (HOSPITAL_BASED_OUTPATIENT_CLINIC_OR_DEPARTMENT_OTHER): Payer: Self-pay

## 2022-06-02 MED ORDER — CLINDAMYCIN PHOSPHATE 1 % EX SOLN
CUTANEOUS | 3 refills | Status: DC
  Filled 2022-06-03: qty 60, 30d supply, fill #0
  Filled 2023-02-14: qty 60, 30d supply, fill #1

## 2022-06-02 MED ORDER — ESTRADIOL 0.05 MG/24HR TD PTTW
1.0000 | MEDICATED_PATCH | TRANSDERMAL | 3 refills | Status: DC
  Filled 2022-06-13: qty 24, 84d supply, fill #0
  Filled 2022-09-03: qty 24, 84d supply, fill #1
  Filled 2022-09-09: qty 8, 28d supply, fill #1
  Filled 2022-10-03: qty 24, 84d supply, fill #2
  Filled 2022-10-10: qty 8, 28d supply, fill #2
  Filled 2022-10-27 – 2022-10-31 (×4): qty 8, 28d supply, fill #3
  Filled 2022-11-30: qty 8, 28d supply, fill #4
  Filled 2022-12-29 – 2022-12-30 (×2): qty 8, 28d supply, fill #5
  Filled 2023-01-26: qty 8, 28d supply, fill #6
  Filled 2023-02-16: qty 8, 28d supply, fill #7
  Filled 2023-03-20: qty 8, 28d supply, fill #8
  Filled 2023-04-16: qty 8, 28d supply, fill #9

## 2022-06-02 MED ORDER — PROGESTERONE MICRONIZED 100 MG PO CAPS
100.0000 mg | ORAL_CAPSULE | Freq: Every day | ORAL | 3 refills | Status: DC
  Filled 2022-06-02 – 2022-07-23 (×2): qty 90, 90d supply, fill #0
  Filled 2022-08-09 – 2022-10-27 (×2): qty 90, 90d supply, fill #1
  Filled 2023-02-10: qty 90, 90d supply, fill #2

## 2022-06-02 MED ORDER — PREDNISOLONE ACETATE 1 % OP SUSP
OPHTHALMIC | 3 refills | Status: DC
  Filled 2022-06-02: qty 10, 50d supply, fill #0
  Filled 2022-06-26: qty 10, 90d supply, fill #0

## 2022-06-02 MED ORDER — IPRATROPIUM BROMIDE 0.06 % NA SOLN
NASAL | 0 refills | Status: DC
  Filled 2022-06-02: qty 15, 30d supply, fill #0

## 2022-06-02 MED ORDER — METRONIDAZOLE 0.75 % EX CREA
TOPICAL_CREAM | CUTANEOUS | 2 refills | Status: DC
  Filled 2022-06-02: qty 45, 30d supply, fill #0

## 2022-06-03 ENCOUNTER — Other Ambulatory Visit (HOSPITAL_BASED_OUTPATIENT_CLINIC_OR_DEPARTMENT_OTHER): Payer: Self-pay

## 2022-06-05 ENCOUNTER — Telehealth: Payer: Self-pay | Admitting: Family Medicine

## 2022-06-05 ENCOUNTER — Other Ambulatory Visit (HOSPITAL_BASED_OUTPATIENT_CLINIC_OR_DEPARTMENT_OTHER): Payer: Self-pay

## 2022-06-05 MED ORDER — LORAZEPAM 0.5 MG PO TABS
0.5000 mg | ORAL_TABLET | Freq: Four times a day (QID) | ORAL | 1 refills | Status: DC | PRN
  Filled 2022-06-05: qty 90, 23d supply, fill #0
  Filled 2022-06-26: qty 90, 23d supply, fill #1

## 2022-06-05 NOTE — Telephone Encounter (Signed)
Patient informed to contact GYN in regards to refills.

## 2022-06-05 NOTE — Telephone Encounter (Signed)
Pharmacy call and stated pt need a refill on LORazepam (ATIVAN) 0.5 MG tablet sent to [facility] at [facility] [place].

## 2022-06-13 ENCOUNTER — Other Ambulatory Visit (HOSPITAL_BASED_OUTPATIENT_CLINIC_OR_DEPARTMENT_OTHER): Payer: Self-pay

## 2022-06-14 ENCOUNTER — Other Ambulatory Visit (HOSPITAL_BASED_OUTPATIENT_CLINIC_OR_DEPARTMENT_OTHER): Payer: Self-pay

## 2022-06-19 ENCOUNTER — Other Ambulatory Visit (HOSPITAL_BASED_OUTPATIENT_CLINIC_OR_DEPARTMENT_OTHER): Payer: Self-pay

## 2022-06-19 MED ORDER — FLUCONAZOLE 150 MG PO TABS
ORAL_TABLET | ORAL | 1 refills | Status: DC
  Filled 2022-06-19: qty 2, 2d supply, fill #0
  Filled 2022-07-10: qty 2, 2d supply, fill #1

## 2022-06-19 MED ORDER — AMOXICILLIN 500 MG PO CAPS
ORAL_CAPSULE | ORAL | 1 refills | Status: DC
  Filled 2022-06-19: qty 30, 10d supply, fill #0
  Filled 2022-07-10: qty 30, 10d supply, fill #1

## 2022-06-19 MED ORDER — CHLORHEXIDINE GLUCONATE 0.12 % MT SOLN
OROMUCOSAL | 0 refills | Status: DC
  Filled 2022-06-19: qty 473, 7d supply, fill #0

## 2022-06-21 ENCOUNTER — Other Ambulatory Visit (HOSPITAL_BASED_OUTPATIENT_CLINIC_OR_DEPARTMENT_OTHER): Payer: Self-pay

## 2022-06-21 MED ORDER — CLINDAMYCIN PHOSPHATE 1 % EX LOTN
TOPICAL_LOTION | CUTANEOUS | 3 refills | Status: DC
  Filled 2022-06-21: qty 60, 30d supply, fill #0
  Filled 2022-07-23: qty 60, 30d supply, fill #1
  Filled 2022-08-23 – 2022-12-15 (×2): qty 60, 30d supply, fill #2
  Filled 2023-01-12: qty 60, 30d supply, fill #3

## 2022-06-22 ENCOUNTER — Other Ambulatory Visit (HOSPITAL_BASED_OUTPATIENT_CLINIC_OR_DEPARTMENT_OTHER): Payer: Self-pay

## 2022-06-22 ENCOUNTER — Encounter (HOSPITAL_BASED_OUTPATIENT_CLINIC_OR_DEPARTMENT_OTHER): Payer: Self-pay | Admitting: Pharmacist

## 2022-06-26 ENCOUNTER — Other Ambulatory Visit (HOSPITAL_BASED_OUTPATIENT_CLINIC_OR_DEPARTMENT_OTHER): Payer: Self-pay

## 2022-07-10 ENCOUNTER — Other Ambulatory Visit (HOSPITAL_BASED_OUTPATIENT_CLINIC_OR_DEPARTMENT_OTHER): Payer: Self-pay

## 2022-07-10 MED ORDER — TRAMADOL HCL 50 MG PO TABS
50.0000 mg | ORAL_TABLET | ORAL | 1 refills | Status: DC
  Filled 2022-07-10: qty 20, 5d supply, fill #0
  Filled 2022-09-03: qty 20, 5d supply, fill #1

## 2022-07-10 MED ORDER — DOXYCYCLINE HYCLATE 100 MG PO CAPS
100.0000 mg | ORAL_CAPSULE | Freq: Every day | ORAL | 1 refills | Status: DC
  Filled 2022-07-10: qty 90, 90d supply, fill #0
  Filled 2022-10-31 – 2023-06-07 (×2): qty 90, 90d supply, fill #1

## 2022-07-17 ENCOUNTER — Other Ambulatory Visit (HOSPITAL_BASED_OUTPATIENT_CLINIC_OR_DEPARTMENT_OTHER): Payer: Self-pay

## 2022-07-18 ENCOUNTER — Other Ambulatory Visit (HOSPITAL_BASED_OUTPATIENT_CLINIC_OR_DEPARTMENT_OTHER): Payer: Self-pay

## 2022-07-18 MED ORDER — LORAZEPAM 0.5 MG PO TABS
0.5000 mg | ORAL_TABLET | Freq: Four times a day (QID) | ORAL | 1 refills | Status: DC | PRN
  Filled 2022-07-18: qty 90, 23d supply, fill #0
  Filled 2022-08-09 (×2): qty 90, 23d supply, fill #1

## 2022-07-23 ENCOUNTER — Other Ambulatory Visit (HOSPITAL_BASED_OUTPATIENT_CLINIC_OR_DEPARTMENT_OTHER): Payer: Self-pay

## 2022-07-24 ENCOUNTER — Other Ambulatory Visit: Payer: Self-pay

## 2022-07-24 ENCOUNTER — Other Ambulatory Visit (HOSPITAL_BASED_OUTPATIENT_CLINIC_OR_DEPARTMENT_OTHER): Payer: Self-pay

## 2022-07-25 DIAGNOSIS — M79671 Pain in right foot: Secondary | ICD-10-CM | POA: Diagnosis not present

## 2022-07-25 DIAGNOSIS — M7661 Achilles tendinitis, right leg: Secondary | ICD-10-CM | POA: Diagnosis not present

## 2022-07-25 DIAGNOSIS — M25571 Pain in right ankle and joints of right foot: Secondary | ICD-10-CM | POA: Diagnosis not present

## 2022-07-27 ENCOUNTER — Other Ambulatory Visit (HOSPITAL_BASED_OUTPATIENT_CLINIC_OR_DEPARTMENT_OTHER): Payer: Self-pay

## 2022-07-28 ENCOUNTER — Other Ambulatory Visit (HOSPITAL_BASED_OUTPATIENT_CLINIC_OR_DEPARTMENT_OTHER): Payer: Self-pay

## 2022-07-28 ENCOUNTER — Encounter: Payer: Self-pay | Admitting: Family Medicine

## 2022-07-28 MED ORDER — FLUCONAZOLE 150 MG PO TABS
150.0000 mg | ORAL_TABLET | ORAL | 0 refills | Status: DC
  Filled 2022-07-28: qty 2, 2d supply, fill #0

## 2022-08-01 ENCOUNTER — Other Ambulatory Visit (HOSPITAL_BASED_OUTPATIENT_CLINIC_OR_DEPARTMENT_OTHER): Payer: Self-pay

## 2022-08-01 MED ORDER — FLUCONAZOLE 150 MG PO TABS
150.0000 mg | ORAL_TABLET | Freq: Once | ORAL | 0 refills | Status: AC
  Filled 2022-08-01: qty 1, 1d supply, fill #0

## 2022-08-02 DIAGNOSIS — M25571 Pain in right ankle and joints of right foot: Secondary | ICD-10-CM | POA: Diagnosis not present

## 2022-08-02 DIAGNOSIS — M7661 Achilles tendinitis, right leg: Secondary | ICD-10-CM | POA: Diagnosis not present

## 2022-08-02 DIAGNOSIS — M79671 Pain in right foot: Secondary | ICD-10-CM | POA: Diagnosis not present

## 2022-08-09 ENCOUNTER — Other Ambulatory Visit: Payer: Self-pay

## 2022-08-09 ENCOUNTER — Other Ambulatory Visit (HOSPITAL_BASED_OUTPATIENT_CLINIC_OR_DEPARTMENT_OTHER): Payer: Self-pay

## 2022-08-10 ENCOUNTER — Other Ambulatory Visit (HOSPITAL_BASED_OUTPATIENT_CLINIC_OR_DEPARTMENT_OTHER): Payer: Self-pay

## 2022-08-10 DIAGNOSIS — M25571 Pain in right ankle and joints of right foot: Secondary | ICD-10-CM | POA: Diagnosis not present

## 2022-08-10 DIAGNOSIS — M79671 Pain in right foot: Secondary | ICD-10-CM | POA: Diagnosis not present

## 2022-08-10 DIAGNOSIS — M7661 Achilles tendinitis, right leg: Secondary | ICD-10-CM | POA: Diagnosis not present

## 2022-08-15 DIAGNOSIS — M25571 Pain in right ankle and joints of right foot: Secondary | ICD-10-CM | POA: Diagnosis not present

## 2022-08-15 DIAGNOSIS — M79671 Pain in right foot: Secondary | ICD-10-CM | POA: Diagnosis not present

## 2022-08-15 NOTE — Telephone Encounter (Signed)
error 

## 2022-08-23 ENCOUNTER — Other Ambulatory Visit: Payer: Self-pay

## 2022-08-23 ENCOUNTER — Other Ambulatory Visit (HOSPITAL_BASED_OUTPATIENT_CLINIC_OR_DEPARTMENT_OTHER): Payer: Self-pay

## 2022-08-23 DIAGNOSIS — M25571 Pain in right ankle and joints of right foot: Secondary | ICD-10-CM | POA: Diagnosis not present

## 2022-08-23 DIAGNOSIS — M79671 Pain in right foot: Secondary | ICD-10-CM | POA: Diagnosis not present

## 2022-08-24 ENCOUNTER — Other Ambulatory Visit (HOSPITAL_BASED_OUTPATIENT_CLINIC_OR_DEPARTMENT_OTHER): Payer: Self-pay

## 2022-08-24 MED ORDER — METRONIDAZOLE 0.75 % EX CREA
TOPICAL_CREAM | CUTANEOUS | 2 refills | Status: DC
  Filled 2022-08-24: qty 45, 30d supply, fill #0
  Filled 2023-08-03: qty 45, 30d supply, fill #1

## 2022-08-29 ENCOUNTER — Other Ambulatory Visit (HOSPITAL_BASED_OUTPATIENT_CLINIC_OR_DEPARTMENT_OTHER): Payer: Self-pay

## 2022-08-29 MED ORDER — LORAZEPAM 0.5 MG PO TABS
0.5000 mg | ORAL_TABLET | Freq: Four times a day (QID) | ORAL | 1 refills | Status: DC | PRN
  Filled 2022-08-29 (×2): qty 90, 23d supply, fill #0
  Filled 2022-08-30: qty 31, 8d supply, fill #0
  Filled 2022-08-30: qty 59, 15d supply, fill #0
  Filled 2022-09-20: qty 90, 23d supply, fill #1

## 2022-08-30 ENCOUNTER — Other Ambulatory Visit (HOSPITAL_BASED_OUTPATIENT_CLINIC_OR_DEPARTMENT_OTHER): Payer: Self-pay

## 2022-08-30 DIAGNOSIS — M79671 Pain in right foot: Secondary | ICD-10-CM | POA: Diagnosis not present

## 2022-08-30 DIAGNOSIS — M25571 Pain in right ankle and joints of right foot: Secondary | ICD-10-CM | POA: Diagnosis not present

## 2022-09-04 ENCOUNTER — Other Ambulatory Visit (HOSPITAL_BASED_OUTPATIENT_CLINIC_OR_DEPARTMENT_OTHER): Payer: Self-pay

## 2022-09-04 DIAGNOSIS — M25571 Pain in right ankle and joints of right foot: Secondary | ICD-10-CM | POA: Diagnosis not present

## 2022-09-09 ENCOUNTER — Other Ambulatory Visit (HOSPITAL_BASED_OUTPATIENT_CLINIC_OR_DEPARTMENT_OTHER): Payer: Self-pay

## 2022-09-20 ENCOUNTER — Other Ambulatory Visit: Payer: Self-pay

## 2022-09-20 ENCOUNTER — Other Ambulatory Visit (HOSPITAL_BASED_OUTPATIENT_CLINIC_OR_DEPARTMENT_OTHER): Payer: Self-pay

## 2022-09-20 MED ORDER — METHOCARBAMOL 500 MG PO TABS
500.0000 mg | ORAL_TABLET | ORAL | 1 refills | Status: DC
  Filled 2022-09-20: qty 60, 15d supply, fill #0
  Filled 2022-10-27: qty 60, 15d supply, fill #1

## 2022-10-03 ENCOUNTER — Other Ambulatory Visit (HOSPITAL_BASED_OUTPATIENT_CLINIC_OR_DEPARTMENT_OTHER): Payer: Self-pay

## 2022-10-06 ENCOUNTER — Other Ambulatory Visit: Payer: BC Managed Care – PPO

## 2022-10-10 ENCOUNTER — Other Ambulatory Visit (HOSPITAL_BASED_OUTPATIENT_CLINIC_OR_DEPARTMENT_OTHER): Payer: Self-pay

## 2022-10-10 MED ORDER — LORAZEPAM 0.5 MG PO TABS
ORAL_TABLET | ORAL | 1 refills | Status: DC
  Filled 2022-10-10 – 2022-10-11 (×2): qty 90, 23d supply, fill #0
  Filled 2022-10-27 – 2022-11-01 (×4): qty 90, 23d supply, fill #1

## 2022-10-11 ENCOUNTER — Other Ambulatory Visit (HOSPITAL_BASED_OUTPATIENT_CLINIC_OR_DEPARTMENT_OTHER): Payer: Self-pay

## 2022-10-11 ENCOUNTER — Other Ambulatory Visit: Payer: Self-pay

## 2022-10-15 IMAGING — MG MM DIGITAL SCREENING BILAT W/ TOMO AND CAD
8 series · 9 of 24 positions shown · non-contrast
Comparison: Previous exam(s).

CLINICAL DATA: Screening.

EXAM:
DIGITAL SCREENING BILATERAL MAMMOGRAM WITH TOMOSYNTHESIS AND CAD
TECHNIQUE: Bilateral screening digital craniocaudal and mediolateral oblique
mammograms were obtained. Bilateral screening digital breast
tomosynthesis was performed. The images were evaluated with
computer-aided detection.

[L CC synth-2D]
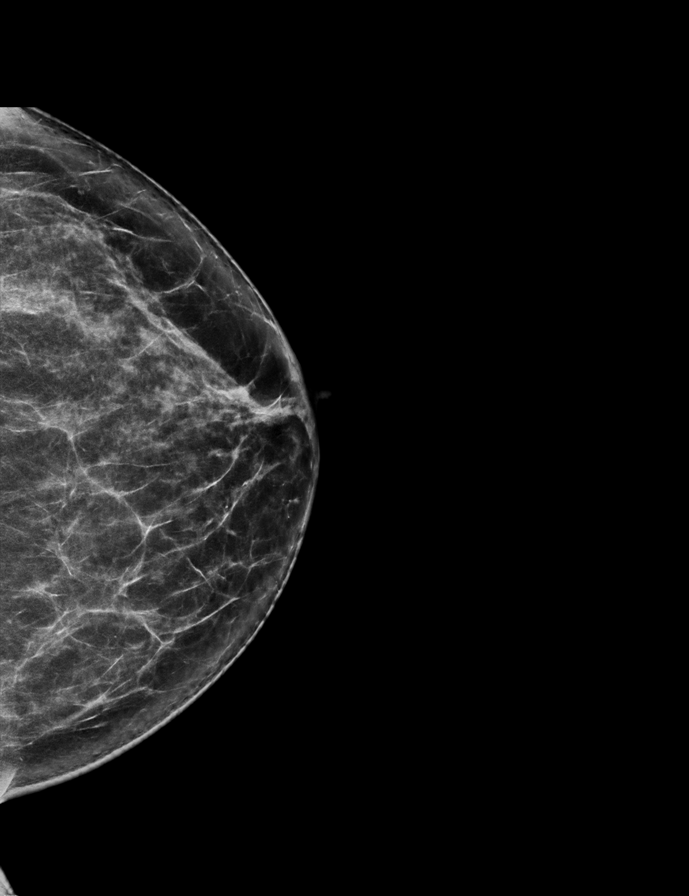

[R MLO synth-2D]
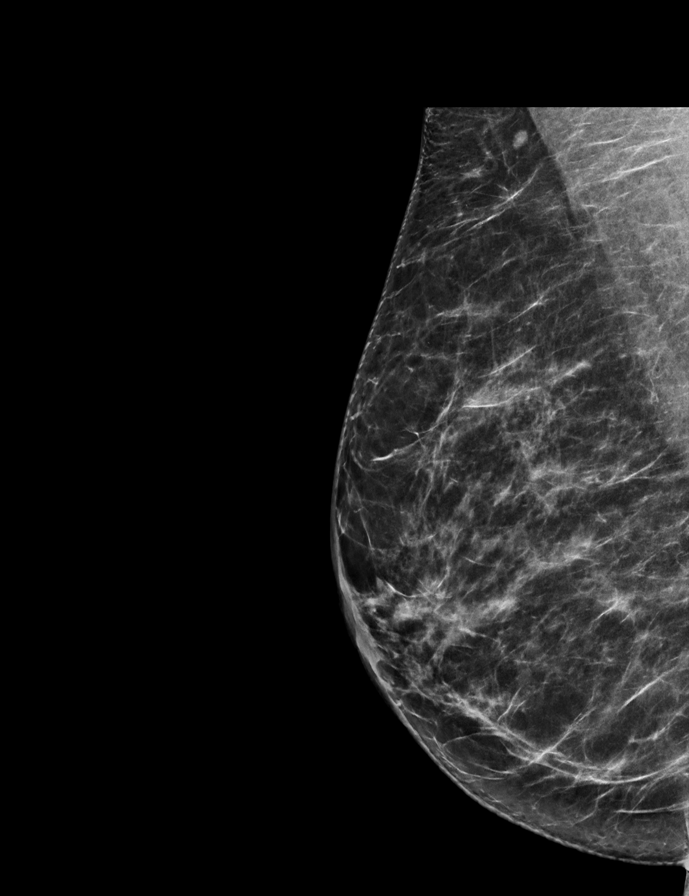

[L MLO synth-2D]
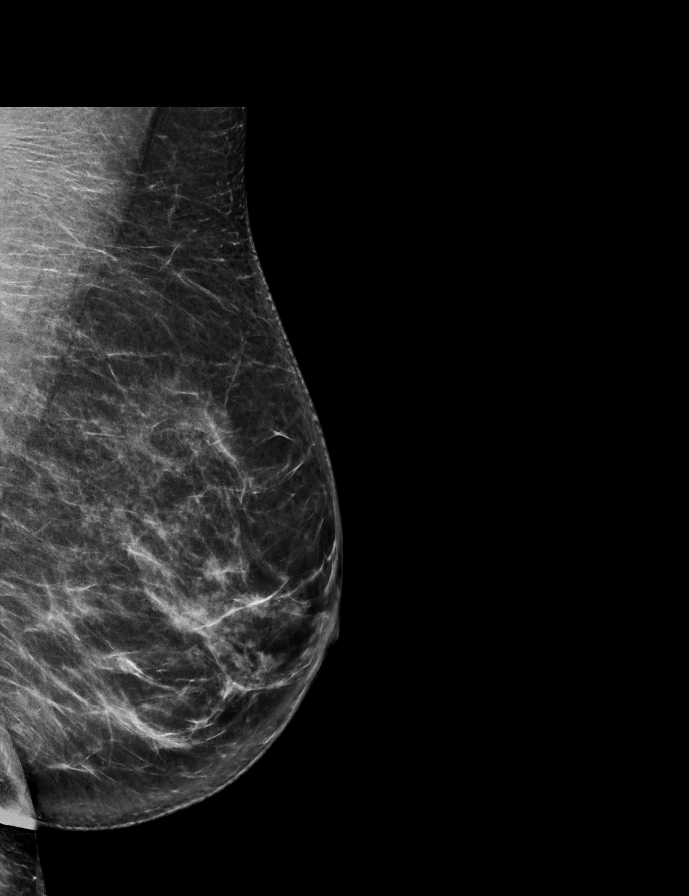

[R CC synth-2D]
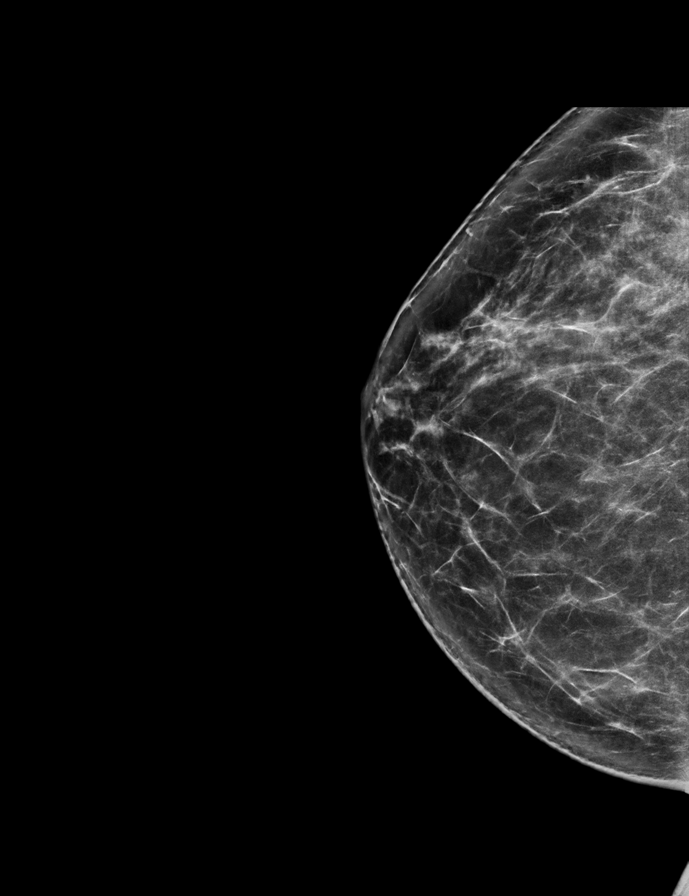

[L CC tomo · 2 of 70 frames shown]
[frame 23/70]
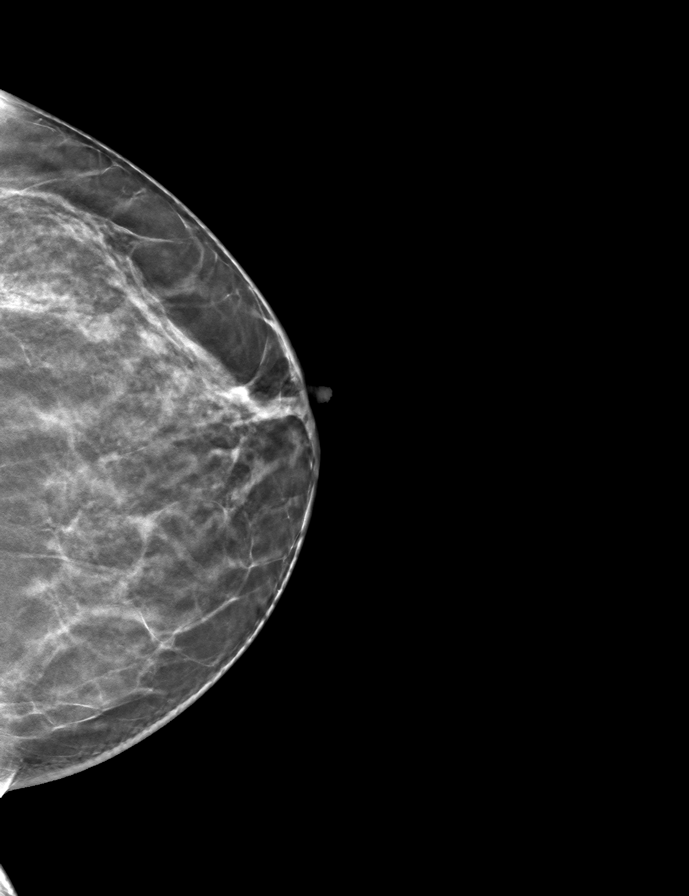
[frame 35/70]
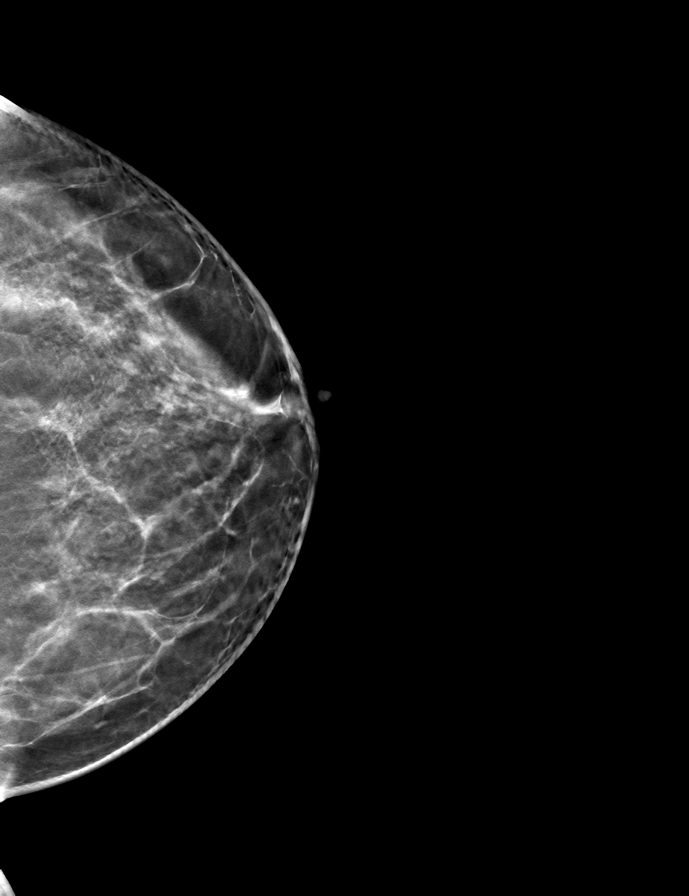

[L MLO tomo · tomo slice 37/73.0]
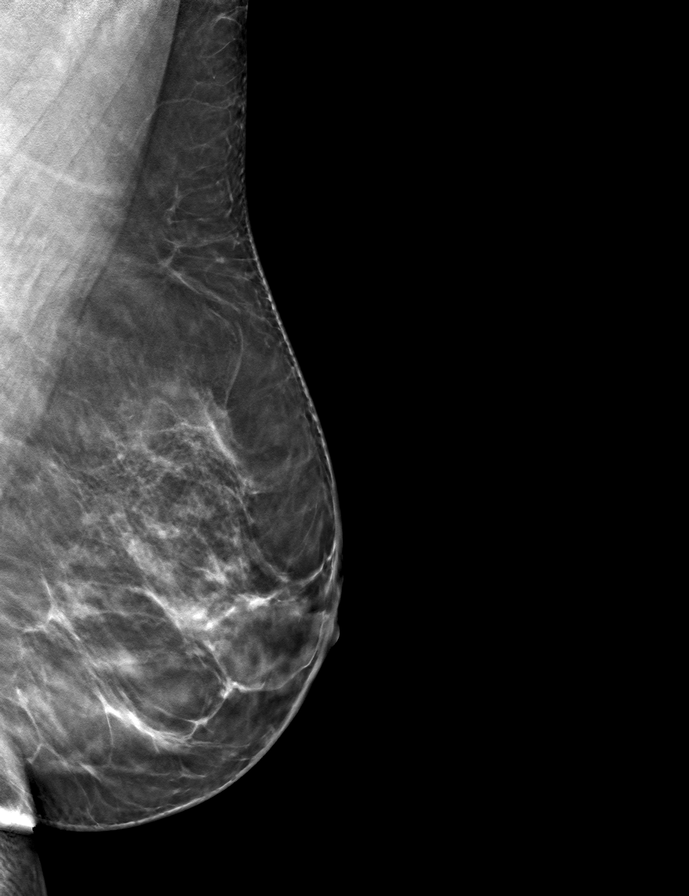

[R MLO tomo · tomo slice 35/69.0]
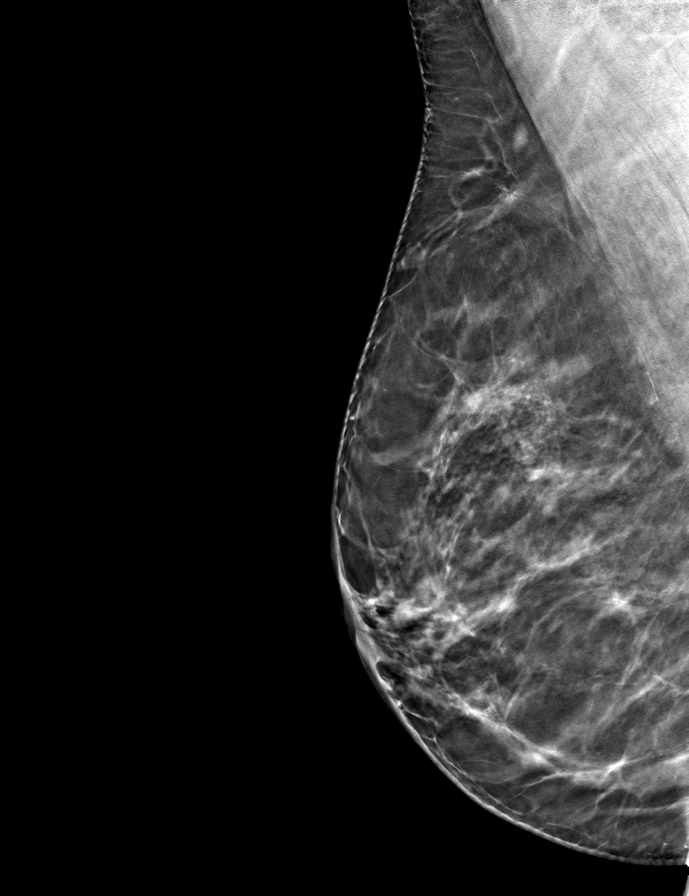

[R CC tomo · tomo slice 37/73.0]
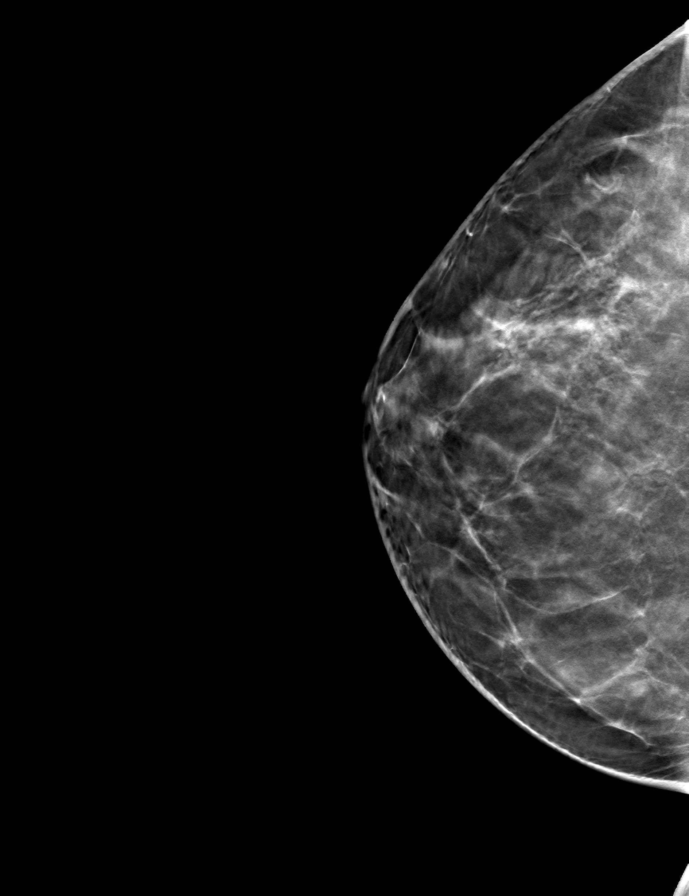

[9 of 24 positions shown; findings below may reference images not displayed]

ACR Breast Density Category b: There are scattered areas of
fibroglandular density.
FINDINGS: There are no findings suspicious for malignancy.
IMPRESSION: No mammographic evidence of malignancy. A result letter of this
screening mammogram will be mailed directly to the patient.

RECOMMENDATION:
Screening mammogram in one year. (Code:51-O-LD2)

BI-RADS CATEGORY  1: Negative.

## 2022-10-27 ENCOUNTER — Other Ambulatory Visit: Payer: Self-pay

## 2022-10-27 ENCOUNTER — Other Ambulatory Visit (HOSPITAL_BASED_OUTPATIENT_CLINIC_OR_DEPARTMENT_OTHER): Payer: Self-pay

## 2022-10-28 ENCOUNTER — Other Ambulatory Visit (HOSPITAL_BASED_OUTPATIENT_CLINIC_OR_DEPARTMENT_OTHER): Payer: Self-pay

## 2022-10-31 ENCOUNTER — Other Ambulatory Visit (HOSPITAL_BASED_OUTPATIENT_CLINIC_OR_DEPARTMENT_OTHER): Payer: Self-pay

## 2022-10-31 ENCOUNTER — Other Ambulatory Visit: Payer: Self-pay

## 2022-11-01 ENCOUNTER — Other Ambulatory Visit (HOSPITAL_BASED_OUTPATIENT_CLINIC_OR_DEPARTMENT_OTHER): Payer: Self-pay

## 2022-11-06 ENCOUNTER — Other Ambulatory Visit (HOSPITAL_BASED_OUTPATIENT_CLINIC_OR_DEPARTMENT_OTHER): Payer: Self-pay

## 2022-11-06 DIAGNOSIS — L718 Other rosacea: Secondary | ICD-10-CM | POA: Diagnosis not present

## 2022-11-06 DIAGNOSIS — D2261 Melanocytic nevi of right upper limb, including shoulder: Secondary | ICD-10-CM | POA: Diagnosis not present

## 2022-11-06 DIAGNOSIS — D2262 Melanocytic nevi of left upper limb, including shoulder: Secondary | ICD-10-CM | POA: Diagnosis not present

## 2022-11-06 DIAGNOSIS — L218 Other seborrheic dermatitis: Secondary | ICD-10-CM | POA: Diagnosis not present

## 2022-11-06 MED ORDER — METRONIDAZOLE 0.75 % EX CREA
TOPICAL_CREAM | CUTANEOUS | 11 refills | Status: DC
Start: 2022-11-06 — End: 2023-06-20
  Filled 2022-11-06: qty 45, 30d supply, fill #0
  Filled 2023-04-02: qty 45, 30d supply, fill #1
  Filled 2023-06-07: qty 45, 30d supply, fill #2

## 2022-11-17 ENCOUNTER — Other Ambulatory Visit: Payer: Self-pay | Admitting: Obstetrics and Gynecology

## 2022-11-17 ENCOUNTER — Ambulatory Visit
Admission: RE | Admit: 2022-11-17 | Discharge: 2022-11-17 | Disposition: A | Discharge status: Home / Self Care | Payer: BC Managed Care – PPO | Source: Ambulatory Visit | Attending: Obstetrics and Gynecology | Admitting: Obstetrics and Gynecology

## 2022-11-17 DIAGNOSIS — N6322 Unspecified lump in the left breast, upper inner quadrant: Secondary | ICD-10-CM | POA: Diagnosis not present

## 2022-11-17 DIAGNOSIS — N6324 Unspecified lump in the left breast, lower inner quadrant: Secondary | ICD-10-CM | POA: Diagnosis not present

## 2022-11-17 DIAGNOSIS — N6320 Unspecified lump in the left breast, unspecified quadrant: Secondary | ICD-10-CM

## 2022-11-17 DIAGNOSIS — R928 Other abnormal and inconclusive findings on diagnostic imaging of breast: Secondary | ICD-10-CM | POA: Diagnosis not present

## 2022-11-22 ENCOUNTER — Other Ambulatory Visit (HOSPITAL_BASED_OUTPATIENT_CLINIC_OR_DEPARTMENT_OTHER): Payer: Self-pay

## 2022-11-23 ENCOUNTER — Other Ambulatory Visit (HOSPITAL_BASED_OUTPATIENT_CLINIC_OR_DEPARTMENT_OTHER): Payer: Self-pay

## 2022-11-23 MED ORDER — LORAZEPAM 0.5 MG PO TABS
0.5000 mg | ORAL_TABLET | Freq: Four times a day (QID) | ORAL | 1 refills | Status: DC
  Filled 2022-11-23 – 2022-11-24 (×2): qty 90, 23d supply, fill #0
  Filled 2022-12-14: qty 90, 23d supply, fill #1

## 2022-11-24 ENCOUNTER — Other Ambulatory Visit (HOSPITAL_BASED_OUTPATIENT_CLINIC_OR_DEPARTMENT_OTHER): Payer: Self-pay

## 2022-11-30 ENCOUNTER — Other Ambulatory Visit (HOSPITAL_BASED_OUTPATIENT_CLINIC_OR_DEPARTMENT_OTHER): Payer: Self-pay

## 2022-12-14 ENCOUNTER — Other Ambulatory Visit (HOSPITAL_BASED_OUTPATIENT_CLINIC_OR_DEPARTMENT_OTHER): Payer: Self-pay

## 2022-12-14 ENCOUNTER — Other Ambulatory Visit: Payer: Self-pay

## 2022-12-15 ENCOUNTER — Other Ambulatory Visit (HOSPITAL_BASED_OUTPATIENT_CLINIC_OR_DEPARTMENT_OTHER): Payer: Self-pay

## 2022-12-15 ENCOUNTER — Other Ambulatory Visit: Payer: Self-pay | Admitting: Family Medicine

## 2022-12-15 ENCOUNTER — Other Ambulatory Visit: Payer: Self-pay

## 2022-12-18 MED ORDER — CLINDAMYCIN PHOSPHATE 1 % EX LOTN: TOPICAL_LOTION | Freq: Two times a day (BID) | CUTANEOUS | 0 refills | Status: DC

## 2022-12-30 ENCOUNTER — Other Ambulatory Visit (HOSPITAL_BASED_OUTPATIENT_CLINIC_OR_DEPARTMENT_OTHER): Payer: Self-pay

## 2023-01-04 ENCOUNTER — Other Ambulatory Visit (HOSPITAL_BASED_OUTPATIENT_CLINIC_OR_DEPARTMENT_OTHER): Payer: Self-pay

## 2023-01-05 ENCOUNTER — Other Ambulatory Visit (HOSPITAL_BASED_OUTPATIENT_CLINIC_OR_DEPARTMENT_OTHER): Payer: Self-pay

## 2023-01-05 MED ORDER — LORAZEPAM 0.5 MG PO TABS
0.5000 mg | ORAL_TABLET | Freq: Four times a day (QID) | ORAL | 1 refills | Status: DC
  Filled 2023-01-05: qty 90, 23d supply, fill #0
  Filled 2023-01-26: qty 90, 23d supply, fill #1

## 2023-01-06 ENCOUNTER — Other Ambulatory Visit (HOSPITAL_BASED_OUTPATIENT_CLINIC_OR_DEPARTMENT_OTHER): Payer: Self-pay

## 2023-01-12 ENCOUNTER — Other Ambulatory Visit (HOSPITAL_BASED_OUTPATIENT_CLINIC_OR_DEPARTMENT_OTHER): Payer: Self-pay

## 2023-01-16 ENCOUNTER — Other Ambulatory Visit (HOSPITAL_BASED_OUTPATIENT_CLINIC_OR_DEPARTMENT_OTHER): Payer: Self-pay

## 2023-01-17 ENCOUNTER — Other Ambulatory Visit (HOSPITAL_BASED_OUTPATIENT_CLINIC_OR_DEPARTMENT_OTHER): Payer: Self-pay

## 2023-01-26 ENCOUNTER — Other Ambulatory Visit: Payer: Self-pay

## 2023-01-26 ENCOUNTER — Other Ambulatory Visit (HOSPITAL_BASED_OUTPATIENT_CLINIC_OR_DEPARTMENT_OTHER): Payer: Self-pay

## 2023-01-29 ENCOUNTER — Other Ambulatory Visit (HOSPITAL_BASED_OUTPATIENT_CLINIC_OR_DEPARTMENT_OTHER): Payer: Self-pay

## 2023-02-10 ENCOUNTER — Other Ambulatory Visit (HOSPITAL_BASED_OUTPATIENT_CLINIC_OR_DEPARTMENT_OTHER): Payer: Self-pay

## 2023-02-14 ENCOUNTER — Other Ambulatory Visit (HOSPITAL_BASED_OUTPATIENT_CLINIC_OR_DEPARTMENT_OTHER): Payer: Self-pay

## 2023-02-16 ENCOUNTER — Other Ambulatory Visit (HOSPITAL_BASED_OUTPATIENT_CLINIC_OR_DEPARTMENT_OTHER): Payer: Self-pay

## 2023-02-17 ENCOUNTER — Other Ambulatory Visit (HOSPITAL_BASED_OUTPATIENT_CLINIC_OR_DEPARTMENT_OTHER): Payer: Self-pay

## 2023-02-18 ENCOUNTER — Other Ambulatory Visit (HOSPITAL_BASED_OUTPATIENT_CLINIC_OR_DEPARTMENT_OTHER): Payer: Self-pay

## 2023-02-18 MED ORDER — LORAZEPAM 0.5 MG PO TABS
0.5000 mg | ORAL_TABLET | Freq: Four times a day (QID) | ORAL | 1 refills | Status: DC
  Filled 2023-02-18: qty 90, 23d supply, fill #0
  Filled 2023-03-11: qty 90, 23d supply, fill #1

## 2023-02-19 ENCOUNTER — Other Ambulatory Visit: Payer: Self-pay

## 2023-02-19 ENCOUNTER — Other Ambulatory Visit (HOSPITAL_BASED_OUTPATIENT_CLINIC_OR_DEPARTMENT_OTHER): Payer: Self-pay

## 2023-02-28 ENCOUNTER — Other Ambulatory Visit (HOSPITAL_BASED_OUTPATIENT_CLINIC_OR_DEPARTMENT_OTHER): Payer: Self-pay

## 2023-03-12 ENCOUNTER — Other Ambulatory Visit: Payer: Self-pay

## 2023-03-13 ENCOUNTER — Other Ambulatory Visit (HOSPITAL_BASED_OUTPATIENT_CLINIC_OR_DEPARTMENT_OTHER): Payer: Self-pay

## 2023-03-20 ENCOUNTER — Other Ambulatory Visit (HOSPITAL_BASED_OUTPATIENT_CLINIC_OR_DEPARTMENT_OTHER): Payer: Self-pay

## 2023-03-20 MED ORDER — CLINDAMYCIN PHOSPHATE 1 % EX LOTN
1.0000 | TOPICAL_LOTION | Freq: Every day | CUTANEOUS | 11 refills | Status: DC
Start: 2023-03-20 — End: 2024-04-16
  Filled 2023-03-20 – 2023-04-02 (×2): qty 60, 30d supply, fill #0
  Filled 2023-05-14: qty 60, 30d supply, fill #1
  Filled 2023-06-07: qty 60, 30d supply, fill #2
  Filled 2023-07-18: qty 60, 30d supply, fill #3
  Filled 2023-09-12: qty 60, 30d supply, fill #4
  Filled 2023-10-13: qty 60, 30d supply, fill #5
  Filled 2023-11-26: qty 60, 30d supply, fill #6
  Filled 2023-12-19: qty 60, 30d supply, fill #7
  Filled 2024-01-28: qty 60, 30d supply, fill #8
  Filled 2024-02-25: qty 60, 30d supply, fill #9

## 2023-03-20 MED ORDER — CLINDAMYCIN PHOSPHATE 1 % EX LOTN
TOPICAL_LOTION | CUTANEOUS | 11 refills | Status: DC
  Filled 2023-03-20: qty 60, 30d supply, fill #0

## 2023-03-21 ENCOUNTER — Other Ambulatory Visit: Payer: Self-pay

## 2023-03-21 ENCOUNTER — Other Ambulatory Visit (HOSPITAL_BASED_OUTPATIENT_CLINIC_OR_DEPARTMENT_OTHER): Payer: Self-pay

## 2023-03-22 ENCOUNTER — Other Ambulatory Visit (HOSPITAL_BASED_OUTPATIENT_CLINIC_OR_DEPARTMENT_OTHER): Payer: Self-pay

## 2023-03-24 ENCOUNTER — Other Ambulatory Visit (HOSPITAL_BASED_OUTPATIENT_CLINIC_OR_DEPARTMENT_OTHER): Payer: Self-pay

## 2023-04-02 ENCOUNTER — Other Ambulatory Visit (HOSPITAL_BASED_OUTPATIENT_CLINIC_OR_DEPARTMENT_OTHER): Payer: Self-pay

## 2023-04-03 ENCOUNTER — Other Ambulatory Visit: Payer: Self-pay

## 2023-04-03 ENCOUNTER — Other Ambulatory Visit (HOSPITAL_BASED_OUTPATIENT_CLINIC_OR_DEPARTMENT_OTHER): Payer: Self-pay

## 2023-04-03 MED ORDER — LORAZEPAM 0.5 MG PO TABS
0.5000 mg | ORAL_TABLET | Freq: Four times a day (QID) | ORAL | 1 refills | Status: DC
  Filled 2023-04-03: qty 90, 23d supply, fill #0
  Filled 2023-04-23 – 2023-04-24 (×3): qty 90, 23d supply, fill #1

## 2023-04-04 ENCOUNTER — Ambulatory Visit
Admission: RE | Admit: 2023-04-04 | Discharge: 2023-04-04 | Disposition: A | Discharge status: Home / Self Care | Payer: BC Managed Care – PPO | Source: Ambulatory Visit | Attending: Obstetrics and Gynecology | Admitting: Obstetrics and Gynecology

## 2023-04-04 ENCOUNTER — Other Ambulatory Visit (HOSPITAL_BASED_OUTPATIENT_CLINIC_OR_DEPARTMENT_OTHER): Payer: Self-pay

## 2023-04-04 DIAGNOSIS — N6320 Unspecified lump in the left breast, unspecified quadrant: Secondary | ICD-10-CM

## 2023-04-22 ENCOUNTER — Other Ambulatory Visit (HOSPITAL_BASED_OUTPATIENT_CLINIC_OR_DEPARTMENT_OTHER): Payer: Self-pay

## 2023-04-23 ENCOUNTER — Other Ambulatory Visit (HOSPITAL_BASED_OUTPATIENT_CLINIC_OR_DEPARTMENT_OTHER): Payer: Self-pay

## 2023-04-24 ENCOUNTER — Other Ambulatory Visit: Payer: Self-pay

## 2023-04-24 ENCOUNTER — Other Ambulatory Visit (HOSPITAL_BASED_OUTPATIENT_CLINIC_OR_DEPARTMENT_OTHER): Payer: Self-pay

## 2023-04-25 ENCOUNTER — Other Ambulatory Visit (HOSPITAL_BASED_OUTPATIENT_CLINIC_OR_DEPARTMENT_OTHER): Payer: Self-pay

## 2023-05-10 ENCOUNTER — Other Ambulatory Visit (HOSPITAL_BASED_OUTPATIENT_CLINIC_OR_DEPARTMENT_OTHER): Payer: Self-pay

## 2023-05-10 MED ORDER — INFLUENZA VIRUS VACC SPLIT PF (FLUZONE) 0.5 ML IM SUSY
0.5000 mL | PREFILLED_SYRINGE | Freq: Once | INTRAMUSCULAR | 0 refills | Status: AC
  Filled 2023-05-10: qty 0.5, 1d supply, fill #0

## 2023-05-14 ENCOUNTER — Other Ambulatory Visit (HOSPITAL_BASED_OUTPATIENT_CLINIC_OR_DEPARTMENT_OTHER): Payer: Self-pay

## 2023-05-15 ENCOUNTER — Other Ambulatory Visit (HOSPITAL_BASED_OUTPATIENT_CLINIC_OR_DEPARTMENT_OTHER): Payer: Self-pay

## 2023-05-15 MED ORDER — PROGESTERONE MICRONIZED 100 MG PO CAPS
100.0000 mg | ORAL_CAPSULE | Freq: Every day | ORAL | 0 refills | Status: DC
  Filled 2023-05-15: qty 90, 90d supply, fill #0

## 2023-05-15 MED ORDER — ESTRADIOL 0.05 MG/24HR TD PTTW
1.0000 | MEDICATED_PATCH | TRANSDERMAL | 0 refills | Status: DC
  Filled 2023-05-15: qty 24, 84d supply, fill #0

## 2023-05-15 MED ORDER — LORAZEPAM 0.5 MG PO TABS
0.5000 mg | ORAL_TABLET | Freq: Four times a day (QID) | ORAL | 0 refills | Status: DC
  Filled 2023-05-15: qty 90, 23d supply, fill #0

## 2023-05-16 ENCOUNTER — Other Ambulatory Visit (HOSPITAL_BASED_OUTPATIENT_CLINIC_OR_DEPARTMENT_OTHER): Payer: Self-pay

## 2023-05-16 MED ORDER — LORAZEPAM 0.5 MG PO TABS
0.5000 mg | ORAL_TABLET | Freq: Four times a day (QID) | ORAL | 1 refills | Status: DC | PRN
  Filled 2023-05-16 – 2023-06-07 (×2): qty 90, 23d supply, fill #0
  Filled 2023-06-27 – 2023-06-28 (×3): qty 90, 23d supply, fill #1

## 2023-05-16 MED ORDER — ESTRADIOL 0.05 MG/24HR TD PTTW
1.0000 | MEDICATED_PATCH | TRANSDERMAL | 3 refills | Status: DC
  Filled 2023-05-16 – 2023-08-03 (×2): qty 24, 84d supply, fill #0
  Filled 2023-10-16: qty 24, 84d supply, fill #1
  Filled 2023-12-19: qty 24, 84d supply, fill #2
  Filled 2024-03-17: qty 24, 84d supply, fill #3

## 2023-05-16 MED ORDER — PROGESTERONE MICRONIZED 100 MG PO CAPS
100.0000 mg | ORAL_CAPSULE | Freq: Every day | ORAL | 3 refills | Status: DC
  Filled 2023-08-03: qty 90, 90d supply, fill #0
  Filled 2023-10-13: qty 90, 90d supply, fill #1
  Filled 2024-02-11 – 2024-02-25 (×2): qty 90, 90d supply, fill #2

## 2023-06-04 ENCOUNTER — Encounter: Payer: BC Managed Care – PPO | Admitting: Family Medicine

## 2023-06-06 ENCOUNTER — Encounter: Payer: BC Managed Care – PPO | Admitting: Family Medicine

## 2023-06-07 ENCOUNTER — Other Ambulatory Visit (HOSPITAL_BASED_OUTPATIENT_CLINIC_OR_DEPARTMENT_OTHER): Payer: Self-pay

## 2023-06-12 NOTE — Telephone Encounter (Signed)
error 

## 2023-06-13 ENCOUNTER — Other Ambulatory Visit (HOSPITAL_BASED_OUTPATIENT_CLINIC_OR_DEPARTMENT_OTHER): Payer: Self-pay

## 2023-06-13 MED ORDER — PREDNISOLONE ACETATE 1 % OP SUSP
1.0000 [drp] | Freq: Every day | OPHTHALMIC | 11 refills | Status: DC
  Filled 2023-06-13: qty 10, 200d supply, fill #0
  Filled 2023-10-13 – 2023-10-25 (×2): qty 10, 200d supply, fill #1
  Filled 2023-10-27: qty 10, 90d supply, fill #1
  Filled 2024-03-17: qty 10, 90d supply, fill #2

## 2023-06-15 ENCOUNTER — Other Ambulatory Visit (HOSPITAL_BASED_OUTPATIENT_CLINIC_OR_DEPARTMENT_OTHER): Payer: Self-pay

## 2023-06-20 ENCOUNTER — Ambulatory Visit (INDEPENDENT_AMBULATORY_CARE_PROVIDER_SITE_OTHER): Payer: BC Managed Care – PPO | Admitting: Family Medicine

## 2023-06-20 ENCOUNTER — Other Ambulatory Visit (INDEPENDENT_AMBULATORY_CARE_PROVIDER_SITE_OTHER): Payer: BC Managed Care – PPO

## 2023-06-20 ENCOUNTER — Other Ambulatory Visit (HOSPITAL_BASED_OUTPATIENT_CLINIC_OR_DEPARTMENT_OTHER): Payer: Self-pay

## 2023-06-20 VITALS — BP 112/80 | HR 60 | Temp 97.6°F | Ht 67.32 in | Wt 161.6 lb

## 2023-06-20 DIAGNOSIS — Z0000 Encounter for general adult medical examination without abnormal findings: Secondary | ICD-10-CM | POA: Diagnosis not present

## 2023-06-20 LAB — LIPID PANEL
Cholesterol: 178 mg/dL (ref 0–200)
HDL: 67.2 mg/dL (ref >39.00)
LDL Cholesterol: 99 mg/dL (ref 0–99)
NonHDL: 110.78
Total CHOL/HDL Ratio: 3
Triglycerides: 60 mg/dL (ref 0.0–149.0)
VLDL: 12 mg/dL (ref 0.0–40.0)

## 2023-06-20 LAB — CBC WITH DIFFERENTIAL/PLATELET
Basophils Absolute: 0 10*3/uL (ref 0.0–0.1)
Basophils Relative: 0.5 % (ref 0.0–3.0)
Eosinophils Absolute: 0.1 10*3/uL (ref 0.0–0.7)
Eosinophils Relative: 1.4 % (ref 0.0–5.0)
HCT: 42.5 % (ref 36.0–46.0)
Hemoglobin: 14.3 g/dL (ref 12.0–15.0)
Lymphocytes Relative: 31.2 % (ref 12.0–46.0)
Lymphs Abs: 1.5 10*3/uL (ref 0.7–4.0)
MCHC: 33.6 g/dL (ref 30.0–36.0)
MCV: 88.7 fL (ref 78.0–100.0)
Monocytes Absolute: 0.4 10*3/uL (ref 0.1–1.0)
Monocytes Relative: 8.4 % (ref 3.0–12.0)
Neutro Abs: 2.9 10*3/uL (ref 1.4–7.7)
Neutrophils Relative %: 58.5 % (ref 43.0–77.0)
Platelets: 304 10*3/uL (ref 150.0–400.0)
RBC: 4.79 Mil/uL (ref 3.87–5.11)
RDW: 13.1 % (ref 11.5–15.5)
WBC: 4.9 10*3/uL (ref 4.0–10.5)

## 2023-06-20 LAB — BASIC METABOLIC PANEL
BUN: 15 mg/dL (ref 6–23)
CO2: 27 meq/L (ref 19–32)
Calcium: 9.5 mg/dL (ref 8.4–10.5)
Chloride: 105 meq/L (ref 96–112)
Creatinine, Ser: 0.69 mg/dL (ref 0.40–1.20)
GFR: 98.3 mL/min (ref >60.00)
Glucose, Bld: 86 mg/dL (ref 70–99)
Potassium: 4.5 meq/L (ref 3.5–5.1)
Sodium: 140 meq/L (ref 135–145)

## 2023-06-20 LAB — HEPATIC FUNCTION PANEL
ALT: 11 U/L (ref 0–35)
AST: 21 U/L (ref 0–37)
Albumin: 4.6 g/dL (ref 3.5–5.2)
Alkaline Phosphatase: 66 U/L (ref 39–117)
Bilirubin, Direct: 0.1 mg/dL (ref 0.0–0.3)
Total Bilirubin: 0.6 mg/dL (ref 0.2–1.2)
Total Protein: 6.8 g/dL (ref 6.0–8.3)

## 2023-06-20 LAB — TSH: TSH: 1.32 u[IU]/mL (ref 0.35–5.50)

## 2023-06-20 NOTE — Patient Instructions (Signed)
Go to lab at [facility] ([address]) in the basement.

## 2023-06-20 NOTE — Progress Notes (Signed)
Established Patient Office Visit  Subjective   Patient ID: Debbie Velez, female    DOB: 10/13/1970  Age: 52 y.o. MRN: [id number]  Chief Complaint  Patient presents with   Annual Exam    HPI   Debbie Velez is seen today for physical exam.  Her husband just retired recently and she is very excited about that.  They have a couple of grandchildren that keep them busy as well.  She is very health-conscious.  She sees GYN regularly.  Pap smear and mammogram up-to-date.  She exercises regularly several days a week with both aerobic and resistance components.  Has never smoked.  Health maintenance reviewed:  Health Maintenance  Topic Date Due   COVID-19 Vaccine (1) Never done   Cervical Cancer Screening (HPV/Pap Cotest)  01/25/2015   HIV Screening  11/29/2036 (Originally 12/01/1983)   MAMMOGRAM  04/03/2025   DTaP/Tdap/Td (3 - Td or Tdap) 05/14/2030   Colonoscopy  10/08/2031   INFLUENZA VACCINE  Completed   Hepatitis C Screening  Completed   Zoster Vaccines- Shingrix  Completed   HPV VACCINES  Aged Out   -She states she did receive Pap smear back in October which was normal  Social history-married with 2 children and 2 grandchildren.  Never smoked.  No regular alcohol.  Exercises regularly.  Family history-father died of cirrhosis complications age 34.  Mom with history of type 2 diabetes.  She has 4 sisters.  1 died of suicide.  1 has lung cancer currently.  Past Medical History:  Diagnosis Date   DYSPNEA 01/24/2010   Past Surgical History:  Procedure Laterality Date   abdomnaplasty  2004   CESAREAN SECTION     1993, 1997   IRRIGATION AND DEBRIDEMENT HEMATOMA     infected abdominal wall surgery 2008   MANDIBLE SURGERY  2001    reports that she has never smoked. She has never used smokeless tobacco. She reports current alcohol use. She reports that she does not use drugs. family history includes Alcohol abuse in her father and sister; Cancer in her father; Diabetes in her  mother; Heart disease in her father; Suicidality in her sister. Allergies  Allergen Reactions   Ciprofloxacin Itching   Pyridium [Phenazopyridine] Swelling    Review of Systems  Constitutional:  Negative for chills, fever, malaise/fatigue and weight loss.  HENT:  Negative for hearing loss.   Eyes:  Negative for blurred vision and double vision.  Respiratory:  Negative for cough and shortness of breath.   Cardiovascular:  Negative for chest pain, palpitations and leg swelling.  Gastrointestinal:  Negative for abdominal pain, blood in stool, constipation and diarrhea.  Genitourinary:  Negative for dysuria.  Skin:  Negative for rash.  Neurological:  Negative for dizziness, speech change, seizures, loss of consciousness and headaches.  Psychiatric/Behavioral:  Negative for depression.       Objective:     BP 112/80 (BP Location: Left Arm, Patient Position: Sitting, Cuff Size: Normal)   Pulse 60   Temp 97.6 F (36.4 C) (Oral)   Ht 5' 7.32" (1.71 m)   Wt 161 lb 9.6 oz (73.3 kg)   SpO2 98%   BMI 25.07 kg/m  BP Readings from Last 3 Encounters:  06/20/23 112/80  05/26/22 130/80  05/16/21 128/88   Wt Readings from Last 3 Encounters:  06/20/23 161 lb 9.6 oz (73.3 kg)  05/26/22 176 lb 12.8 oz (80.2 kg)  05/16/21 170 lb 11.2 oz (77.4 kg)      Physical Exam  Vitals reviewed.  Constitutional:      General: She is not in acute distress.    Appearance: She is well-developed. She is not ill-appearing.  HENT:     Head: Normocephalic and atraumatic.  Eyes:     Pupils: Pupils are equal, round, and reactive to light.  Neck:     Thyroid: No thyromegaly.  Cardiovascular:     Rate and Rhythm: Normal rate and regular rhythm.     Heart sounds: Normal heart sounds. No murmur heard. Pulmonary:     Effort: No respiratory distress.     Breath sounds: Normal breath sounds. No wheezing or rales.  Abdominal:     General: Bowel sounds are normal. There is no distension.     Palpations:  Abdomen is soft. There is no mass.     Tenderness: There is no abdominal tenderness. There is no guarding or rebound.  Musculoskeletal:        General: Normal range of motion.     Cervical back: Normal range of motion and neck supple.  Lymphadenopathy:     Cervical: No cervical adenopathy.  Skin:    Findings: No rash.  Neurological:     Mental Status: She is alert and oriented to person, place, and time.     Cranial Nerves: No cranial nerve deficit.  Psychiatric:        Behavior: Behavior normal.        Thought Content: Thought content normal.        Judgment: Judgment normal.      No results found for any visits on 06/20/23.    The 10-year ASCVD risk score (Arnett DK, et al., 2019) is: 1.1%    Assessment & Plan:   Physical exam.  Healthy 52 year old female.  She is very health-conscious and exercises regularly.  She has lost weight this past year due to dietary changes.  -Vaccines up-to-date -Pap smear mammogram up-to-date -Colonoscopy due 2033 -She plans to continue yearly GYN follow-up -Obtain follow-up screening labs.  Patient does request TSH.  We explained this may not be covered by insurance but she would like to check nevertheless   No follow-ups on file.    Evelena Peat, MD

## 2023-06-27 ENCOUNTER — Other Ambulatory Visit (HOSPITAL_BASED_OUTPATIENT_CLINIC_OR_DEPARTMENT_OTHER): Payer: Self-pay

## 2023-06-28 ENCOUNTER — Other Ambulatory Visit: Payer: Self-pay

## 2023-06-28 ENCOUNTER — Other Ambulatory Visit (HOSPITAL_BASED_OUTPATIENT_CLINIC_OR_DEPARTMENT_OTHER): Payer: Self-pay

## 2023-07-02 ENCOUNTER — Other Ambulatory Visit (HOSPITAL_BASED_OUTPATIENT_CLINIC_OR_DEPARTMENT_OTHER): Payer: Self-pay

## 2023-07-18 ENCOUNTER — Other Ambulatory Visit (HOSPITAL_BASED_OUTPATIENT_CLINIC_OR_DEPARTMENT_OTHER): Payer: Self-pay

## 2023-07-19 ENCOUNTER — Other Ambulatory Visit: Payer: Self-pay

## 2023-07-19 ENCOUNTER — Other Ambulatory Visit (HOSPITAL_BASED_OUTPATIENT_CLINIC_OR_DEPARTMENT_OTHER): Payer: Self-pay

## 2023-07-19 MED ORDER — LORAZEPAM 0.5 MG PO TABS
0.5000 mg | ORAL_TABLET | Freq: Four times a day (QID) | ORAL | 1 refills | Status: DC
  Filled 2023-07-19: qty 90, 23d supply, fill #0
  Filled 2023-08-11 (×2): qty 90, 23d supply, fill #1

## 2023-08-03 ENCOUNTER — Other Ambulatory Visit (HOSPITAL_BASED_OUTPATIENT_CLINIC_OR_DEPARTMENT_OTHER): Payer: Self-pay

## 2023-08-03 ENCOUNTER — Other Ambulatory Visit: Payer: Self-pay

## 2023-08-07 ENCOUNTER — Other Ambulatory Visit (HOSPITAL_BASED_OUTPATIENT_CLINIC_OR_DEPARTMENT_OTHER): Payer: Self-pay

## 2023-08-09 ENCOUNTER — Other Ambulatory Visit (HOSPITAL_BASED_OUTPATIENT_CLINIC_OR_DEPARTMENT_OTHER): Payer: Self-pay

## 2023-08-09 MED ORDER — METHOCARBAMOL 500 MG PO TABS
500.0000 mg | ORAL_TABLET | Freq: Four times a day (QID) | ORAL | 1 refills | Status: DC | PRN
  Filled 2023-08-09: qty 60, 15d supply, fill #0
  Filled 2023-11-26: qty 60, 15d supply, fill #1

## 2023-08-10 ENCOUNTER — Other Ambulatory Visit (HOSPITAL_BASED_OUTPATIENT_CLINIC_OR_DEPARTMENT_OTHER): Payer: Self-pay

## 2023-08-11 ENCOUNTER — Other Ambulatory Visit (HOSPITAL_BASED_OUTPATIENT_CLINIC_OR_DEPARTMENT_OTHER): Payer: Self-pay

## 2023-09-01 ENCOUNTER — Other Ambulatory Visit (HOSPITAL_BASED_OUTPATIENT_CLINIC_OR_DEPARTMENT_OTHER): Payer: Self-pay

## 2023-09-03 ENCOUNTER — Other Ambulatory Visit (HOSPITAL_BASED_OUTPATIENT_CLINIC_OR_DEPARTMENT_OTHER): Payer: Self-pay

## 2023-09-03 MED ORDER — LORAZEPAM 0.5 MG PO TABS
0.5000 mg | ORAL_TABLET | Freq: Four times a day (QID) | ORAL | 1 refills | Status: DC | PRN
  Filled 2023-09-03: qty 90, 23d supply, fill #0
  Filled 2023-09-24: qty 90, 23d supply, fill #1

## 2023-09-06 ENCOUNTER — Other Ambulatory Visit (HOSPITAL_BASED_OUTPATIENT_CLINIC_OR_DEPARTMENT_OTHER): Payer: Self-pay

## 2023-09-12 ENCOUNTER — Other Ambulatory Visit (HOSPITAL_BASED_OUTPATIENT_CLINIC_OR_DEPARTMENT_OTHER): Payer: Self-pay

## 2023-09-25 ENCOUNTER — Other Ambulatory Visit: Payer: Self-pay

## 2023-10-13 ENCOUNTER — Other Ambulatory Visit (HOSPITAL_BASED_OUTPATIENT_CLINIC_OR_DEPARTMENT_OTHER): Payer: Self-pay

## 2023-10-15 ENCOUNTER — Other Ambulatory Visit (HOSPITAL_BASED_OUTPATIENT_CLINIC_OR_DEPARTMENT_OTHER): Payer: Self-pay

## 2023-10-15 ENCOUNTER — Other Ambulatory Visit: Payer: Self-pay

## 2023-10-15 MED ORDER — LORAZEPAM 0.5 MG PO TABS
0.5000 mg | ORAL_TABLET | Freq: Four times a day (QID) | ORAL | 1 refills | Status: DC | PRN
  Filled 2023-10-15: qty 90, 23d supply, fill #0
  Filled 2023-11-03: qty 90, 23d supply, fill #1

## 2023-10-16 ENCOUNTER — Other Ambulatory Visit (HOSPITAL_BASED_OUTPATIENT_CLINIC_OR_DEPARTMENT_OTHER): Payer: Self-pay

## 2023-10-25 ENCOUNTER — Other Ambulatory Visit (HOSPITAL_BASED_OUTPATIENT_CLINIC_OR_DEPARTMENT_OTHER): Payer: Self-pay

## 2023-10-27 ENCOUNTER — Other Ambulatory Visit (HOSPITAL_BASED_OUTPATIENT_CLINIC_OR_DEPARTMENT_OTHER): Payer: Self-pay

## 2023-11-05 ENCOUNTER — Other Ambulatory Visit (HOSPITAL_BASED_OUTPATIENT_CLINIC_OR_DEPARTMENT_OTHER): Payer: Self-pay

## 2023-11-05 ENCOUNTER — Other Ambulatory Visit: Payer: Self-pay

## 2023-11-16 ENCOUNTER — Other Ambulatory Visit (HOSPITAL_COMMUNITY): Payer: Self-pay

## 2023-11-26 ENCOUNTER — Other Ambulatory Visit (HOSPITAL_BASED_OUTPATIENT_CLINIC_OR_DEPARTMENT_OTHER): Payer: Self-pay

## 2023-11-27 ENCOUNTER — Other Ambulatory Visit (HOSPITAL_BASED_OUTPATIENT_CLINIC_OR_DEPARTMENT_OTHER): Payer: Self-pay

## 2023-11-27 ENCOUNTER — Other Ambulatory Visit: Payer: Self-pay

## 2023-11-27 MED ORDER — LORAZEPAM 0.5 MG PO TABS
0.5000 mg | ORAL_TABLET | Freq: Four times a day (QID) | ORAL | 1 refills | Status: DC
  Filled 2023-11-27 (×2): qty 90, 23d supply, fill #0
  Filled 2023-12-19: qty 90, 23d supply, fill #1

## 2023-11-29 ENCOUNTER — Other Ambulatory Visit (HOSPITAL_BASED_OUTPATIENT_CLINIC_OR_DEPARTMENT_OTHER): Payer: Self-pay

## 2023-12-01 ENCOUNTER — Other Ambulatory Visit (HOSPITAL_BASED_OUTPATIENT_CLINIC_OR_DEPARTMENT_OTHER): Payer: Self-pay

## 2023-12-01 MED ORDER — AZELASTINE HCL 0.1 % NA SOLN
NASAL | 0 refills | Status: DC
Start: 2023-12-01 — End: 2024-04-27
  Filled 2023-12-01: qty 30, 14d supply, fill #0

## 2023-12-01 MED ORDER — AMOXICILLIN-POT CLAVULANATE 875-125 MG PO TABS
ORAL_TABLET | ORAL | 0 refills | Status: DC
Start: 2023-12-01 — End: 2024-04-27
  Filled 2023-12-01: qty 14, 7d supply, fill #0

## 2023-12-01 MED ORDER — FLUCONAZOLE 200 MG PO TABS
ORAL_TABLET | ORAL | 0 refills | Status: DC
  Filled 2023-12-01: qty 1, 1d supply, fill #0

## 2023-12-19 ENCOUNTER — Other Ambulatory Visit: Payer: Self-pay

## 2023-12-19 ENCOUNTER — Other Ambulatory Visit (HOSPITAL_BASED_OUTPATIENT_CLINIC_OR_DEPARTMENT_OTHER): Payer: Self-pay

## 2023-12-26 ENCOUNTER — Other Ambulatory Visit (HOSPITAL_BASED_OUTPATIENT_CLINIC_OR_DEPARTMENT_OTHER): Payer: Self-pay

## 2024-01-09 ENCOUNTER — Other Ambulatory Visit (HOSPITAL_BASED_OUTPATIENT_CLINIC_OR_DEPARTMENT_OTHER): Payer: Self-pay

## 2024-01-10 ENCOUNTER — Other Ambulatory Visit (HOSPITAL_BASED_OUTPATIENT_CLINIC_OR_DEPARTMENT_OTHER): Payer: Self-pay

## 2024-01-10 MED ORDER — LORAZEPAM 0.5 MG PO TABS
0.5000 mg | ORAL_TABLET | Freq: Four times a day (QID) | ORAL | 1 refills | Status: DC | PRN
  Filled 2024-01-10: qty 90, 23d supply, fill #0
  Filled 2024-01-30 – 2024-02-02 (×2): qty 90, 23d supply, fill #1

## 2024-01-11 ENCOUNTER — Other Ambulatory Visit (HOSPITAL_BASED_OUTPATIENT_CLINIC_OR_DEPARTMENT_OTHER): Payer: Self-pay

## 2024-01-28 ENCOUNTER — Other Ambulatory Visit (HOSPITAL_BASED_OUTPATIENT_CLINIC_OR_DEPARTMENT_OTHER): Payer: Self-pay

## 2024-01-30 ENCOUNTER — Other Ambulatory Visit (HOSPITAL_BASED_OUTPATIENT_CLINIC_OR_DEPARTMENT_OTHER): Payer: Self-pay

## 2024-01-31 ENCOUNTER — Other Ambulatory Visit: Payer: Self-pay

## 2024-01-31 ENCOUNTER — Other Ambulatory Visit (HOSPITAL_BASED_OUTPATIENT_CLINIC_OR_DEPARTMENT_OTHER): Payer: Self-pay

## 2024-02-04 ENCOUNTER — Other Ambulatory Visit: Payer: Self-pay

## 2024-02-12 ENCOUNTER — Other Ambulatory Visit: Payer: Self-pay

## 2024-02-21 ENCOUNTER — Other Ambulatory Visit: Payer: Self-pay | Admitting: Obstetrics and Gynecology

## 2024-02-21 DIAGNOSIS — N6012 Diffuse cystic mastopathy of left breast: Secondary | ICD-10-CM

## 2024-02-23 ENCOUNTER — Other Ambulatory Visit (HOSPITAL_BASED_OUTPATIENT_CLINIC_OR_DEPARTMENT_OTHER): Payer: Self-pay

## 2024-02-25 ENCOUNTER — Other Ambulatory Visit: Payer: Self-pay

## 2024-02-25 ENCOUNTER — Other Ambulatory Visit (HOSPITAL_BASED_OUTPATIENT_CLINIC_OR_DEPARTMENT_OTHER): Payer: Self-pay

## 2024-02-25 MED ORDER — LORAZEPAM 0.5 MG PO TABS
0.5000 mg | ORAL_TABLET | Freq: Four times a day (QID) | ORAL | 1 refills | Status: DC | PRN
  Filled 2024-02-25: qty 90, 23d supply, fill #0
  Filled 2024-03-17: qty 90, 23d supply, fill #1

## 2024-03-17 ENCOUNTER — Other Ambulatory Visit: Payer: Self-pay

## 2024-03-17 ENCOUNTER — Other Ambulatory Visit (HOSPITAL_BASED_OUTPATIENT_CLINIC_OR_DEPARTMENT_OTHER): Payer: Self-pay

## 2024-03-18 ENCOUNTER — Other Ambulatory Visit (HOSPITAL_BASED_OUTPATIENT_CLINIC_OR_DEPARTMENT_OTHER): Payer: Self-pay

## 2024-03-28 ENCOUNTER — Other Ambulatory Visit (HOSPITAL_BASED_OUTPATIENT_CLINIC_OR_DEPARTMENT_OTHER): Payer: Self-pay

## 2024-03-31 ENCOUNTER — Other Ambulatory Visit (HOSPITAL_BASED_OUTPATIENT_CLINIC_OR_DEPARTMENT_OTHER): Payer: Self-pay

## 2024-03-31 MED ORDER — METRONIDAZOLE 0.75 % EX CREA
TOPICAL_CREAM | CUTANEOUS | 3 refills | Status: AC
  Filled 2024-03-31: qty 45, 30d supply, fill #0
  Filled 2024-06-12: qty 45, 30d supply, fill #1
  Filled 2024-08-18: qty 45, 30d supply, fill #2

## 2024-04-04 ENCOUNTER — Ambulatory Visit
Admission: RE | Admit: 2024-04-04 | Discharge: 2024-04-04 | Disposition: A | Discharge status: Home / Self Care | Source: Ambulatory Visit | Attending: Obstetrics and Gynecology | Admitting: Obstetrics and Gynecology

## 2024-04-04 DIAGNOSIS — N6012 Diffuse cystic mastopathy of left breast: Secondary | ICD-10-CM

## 2024-04-07 ENCOUNTER — Other Ambulatory Visit (HOSPITAL_BASED_OUTPATIENT_CLINIC_OR_DEPARTMENT_OTHER): Payer: Self-pay

## 2024-04-08 ENCOUNTER — Other Ambulatory Visit (HOSPITAL_BASED_OUTPATIENT_CLINIC_OR_DEPARTMENT_OTHER): Payer: Self-pay

## 2024-04-09 ENCOUNTER — Other Ambulatory Visit (HOSPITAL_BASED_OUTPATIENT_CLINIC_OR_DEPARTMENT_OTHER): Payer: Self-pay

## 2024-04-09 ENCOUNTER — Other Ambulatory Visit: Payer: Self-pay

## 2024-04-09 MED ORDER — LORAZEPAM 0.5 MG PO TABS
0.5000 mg | ORAL_TABLET | Freq: Four times a day (QID) | ORAL | 1 refills | Status: DC | PRN
  Filled 2024-04-09: qty 90, 23d supply, fill #0
  Filled 2024-04-30: qty 90, 23d supply, fill #1

## 2024-04-11 ENCOUNTER — Other Ambulatory Visit (HOSPITAL_BASED_OUTPATIENT_CLINIC_OR_DEPARTMENT_OTHER): Payer: Self-pay

## 2024-04-15 ENCOUNTER — Telehealth: Payer: Self-pay | Admitting: *Deleted

## 2024-04-15 NOTE — Telephone Encounter (Signed)
 Copied from CRM [id number]. Topic: Appointments - Transfer of Care >> Apr 15, 2024  2:28 PM Montie POUR wrote: Pt is requesting to transfer FROM: Dr. Wolm Scarlet Pt is requesting to transfer TO: FNP Thersia Stark Reason for requested transfer: She likes the [facility] It is the responsibility of the team the patient would like to transfer to (FNP [employer]) to reach out to the patient if for any reason this transfer is not acceptable.

## 2024-04-16 ENCOUNTER — Other Ambulatory Visit (HOSPITAL_BASED_OUTPATIENT_CLINIC_OR_DEPARTMENT_OTHER): Payer: Self-pay

## 2024-04-16 MED ORDER — CLINDAMYCIN PHOSPHATE 1 % EX LOTN
1.0000 | TOPICAL_LOTION | Freq: Every day | CUTANEOUS | 10 refills | Status: DC
  Filled 2024-04-16: qty 60, 30d supply, fill #0
  Filled 2024-05-04: qty 60, 30d supply, fill #1
  Filled 2024-06-27: qty 60, 30d supply, fill #2

## 2024-04-30 ENCOUNTER — Other Ambulatory Visit: Payer: Self-pay

## 2024-05-05 ENCOUNTER — Other Ambulatory Visit (HOSPITAL_BASED_OUTPATIENT_CLINIC_OR_DEPARTMENT_OTHER): Payer: Self-pay

## 2024-05-14 ENCOUNTER — Other Ambulatory Visit (HOSPITAL_BASED_OUTPATIENT_CLINIC_OR_DEPARTMENT_OTHER): Payer: Self-pay

## 2024-05-14 MED ORDER — FLUZONE 0.5 ML IM SUSY
0.5000 mL | PREFILLED_SYRINGE | Freq: Once | INTRAMUSCULAR | 0 refills | Status: AC
  Filled 2024-05-14: qty 0.5, 1d supply, fill #0

## 2024-05-19 ENCOUNTER — Other Ambulatory Visit (HOSPITAL_BASED_OUTPATIENT_CLINIC_OR_DEPARTMENT_OTHER): Payer: Self-pay

## 2024-05-19 MED ORDER — LORAZEPAM 0.5 MG PO TABS
0.5000 mg | ORAL_TABLET | Freq: Four times a day (QID) | ORAL | 0 refills | Status: DC | PRN
  Filled 2024-05-23: qty 90, 23d supply, fill #0

## 2024-05-21 ENCOUNTER — Other Ambulatory Visit (HOSPITAL_BASED_OUTPATIENT_CLINIC_OR_DEPARTMENT_OTHER): Payer: Self-pay

## 2024-05-21 MED ORDER — ESTRADIOL 0.05 MG/24HR TD PTTW
1.0000 | MEDICATED_PATCH | TRANSDERMAL | 3 refills | Status: AC
  Filled 2024-05-21: qty 24, 84d supply, fill #0
  Filled 2024-09-09: qty 24, 84d supply, fill #1

## 2024-05-21 MED ORDER — PROGESTERONE MICRONIZED 100 MG PO CAPS
100.0000 mg | ORAL_CAPSULE | Freq: Every day | ORAL | 3 refills | Status: AC
  Filled 2024-05-21: qty 90, 90d supply, fill #0
  Filled 2024-08-18 – 2024-08-19 (×2): qty 90, 90d supply, fill #1

## 2024-05-21 MED ORDER — METHOCARBAMOL 500 MG PO TABS
500.0000 mg | ORAL_TABLET | Freq: Four times a day (QID) | ORAL | 1 refills | Status: AC | PRN
  Filled 2024-05-21: qty 60, 15d supply, fill #0

## 2024-05-21 MED ORDER — LORAZEPAM 0.5 MG PO TABS
0.5000 mg | ORAL_TABLET | Freq: Four times a day (QID) | ORAL | 2 refills | Status: DC | PRN
  Filled 2024-05-21: qty 90, 23d supply, fill #0
  Filled 2024-06-12: qty 90, 23d supply, fill #1
  Filled 2024-07-06: qty 90, 23d supply, fill #2

## 2024-05-23 ENCOUNTER — Other Ambulatory Visit (HOSPITAL_BASED_OUTPATIENT_CLINIC_OR_DEPARTMENT_OTHER): Payer: Self-pay

## 2024-05-23 ENCOUNTER — Other Ambulatory Visit: Payer: Self-pay

## 2024-05-25 LAB — HM PAP SMEAR

## 2024-05-27 ENCOUNTER — Other Ambulatory Visit (HOSPITAL_BASED_OUTPATIENT_CLINIC_OR_DEPARTMENT_OTHER): Payer: Self-pay

## 2024-06-12 ENCOUNTER — Other Ambulatory Visit (HOSPITAL_BASED_OUTPATIENT_CLINIC_OR_DEPARTMENT_OTHER): Payer: Self-pay

## 2024-06-12 ENCOUNTER — Other Ambulatory Visit: Payer: Self-pay

## 2024-06-13 ENCOUNTER — Other Ambulatory Visit (HOSPITAL_BASED_OUTPATIENT_CLINIC_OR_DEPARTMENT_OTHER): Payer: Self-pay

## 2024-06-28 ENCOUNTER — Other Ambulatory Visit (HOSPITAL_BASED_OUTPATIENT_CLINIC_OR_DEPARTMENT_OTHER): Payer: Self-pay

## 2024-06-30 ENCOUNTER — Other Ambulatory Visit (HOSPITAL_BASED_OUTPATIENT_CLINIC_OR_DEPARTMENT_OTHER): Payer: Self-pay

## 2024-07-07 ENCOUNTER — Other Ambulatory Visit: Payer: Self-pay

## 2024-07-16 ENCOUNTER — Ambulatory Visit (INDEPENDENT_AMBULATORY_CARE_PROVIDER_SITE_OTHER): Admitting: Family Medicine

## 2024-07-16 ENCOUNTER — Encounter (HOSPITAL_BASED_OUTPATIENT_CLINIC_OR_DEPARTMENT_OTHER): Payer: Self-pay | Admitting: *Deleted

## 2024-07-16 ENCOUNTER — Other Ambulatory Visit (HOSPITAL_BASED_OUTPATIENT_CLINIC_OR_DEPARTMENT_OTHER): Payer: Self-pay

## 2024-07-16 ENCOUNTER — Encounter (HOSPITAL_BASED_OUTPATIENT_CLINIC_OR_DEPARTMENT_OTHER): Payer: Self-pay | Admitting: Family Medicine

## 2024-07-16 VITALS — BP 120/80 | HR 67 | Ht 67.0 in | Wt 169.9 lb

## 2024-07-16 DIAGNOSIS — Z7689 Persons encountering health services in other specified circumstances: Secondary | ICD-10-CM | POA: Diagnosis not present

## 2024-07-16 DIAGNOSIS — Z0000 Encounter for general adult medical examination without abnormal findings: Secondary | ICD-10-CM | POA: Diagnosis not present

## 2024-07-16 DIAGNOSIS — Z13220 Encounter for screening for lipoid disorders: Secondary | ICD-10-CM | POA: Diagnosis not present

## 2024-07-16 DIAGNOSIS — R87619 Unspecified abnormal cytological findings in specimens from cervix uteri: Secondary | ICD-10-CM | POA: Insufficient documentation

## 2024-07-16 DIAGNOSIS — K219 Gastro-esophageal reflux disease without esophagitis: Secondary | ICD-10-CM | POA: Insufficient documentation

## 2024-07-16 DIAGNOSIS — B379 Candidiasis, unspecified: Secondary | ICD-10-CM | POA: Diagnosis not present

## 2024-07-16 MED ORDER — FLUCONAZOLE 150 MG PO TABS
150.0000 mg | ORAL_TABLET | Freq: Once | ORAL | 0 refills | Status: AC
  Filled 2024-07-16 – 2024-07-28 (×2): qty 2, 2d supply, fill #0

## 2024-07-16 NOTE — Patient Instructions (Signed)

## 2024-07-16 NOTE — Progress Notes (Signed)
 Subjective:   Debbie Velez 1970-09-19  07/16/2024   CC: Chief Complaint  Patient presents with   New Patient (Initial Visit)    Patient is here today to get established with the practice. Denies any concerns for today's visit.    HPI: Debbie Velez is a 53 y.o. female who presents to establish care and for a routine health maintenance exam.  Labs collected at time of visit. She is a very active female and exercises regularly. Never smoker.    REFILL REQUEST:  Patient has hx of recurrent yeast infections and is requesting refill of Diflucan  to have when symptoms occur. Patient is established with OBGYN who manages HRT with estradiol  and progesterone . Last pap was October 2025 with Dr. Mat. Will obtain records.    HEALTH SCREENINGS: - Vision Screening: up to date - Dental Visits: up to date - Pap smear: UTD with Dr. Mat in October 2025 - Breast Exam: UTD - STD Screening: Declined - Mammogram (40+): Up to date  - Colonoscopy (45+): Up to date  - Bone Density (65+ or under 65 with predisposing conditions): Not applicable  - Lung CA screening with low-dose CT:  Not applicable Adults age 64-80 who are current cigarette smokers or quit within the last 15 years. Must have 20 pack year history.   Depression and Anxiety Screen done today and results listed below:     07/16/2024    8:16 AM 06/20/2023    7:48 AM 05/26/2022    8:07 AM 05/14/2020    7:38 AM 04/04/2018   10:04 AM  Depression screen PHQ 2/9  Decreased Interest 0 0 0 0 0  Down, Depressed, Hopeless 0 0 0 0 0  PHQ - 2 Score 0 0 0 0 0  Altered sleeping 0      Tired, decreased energy 0      Change in appetite 0      Feeling bad or failure about yourself  0      Trouble concentrating 0      Moving slowly or fidgety/restless 0      Suicidal thoughts 0      PHQ-9 Score 0      Difficult doing work/chores Not difficult at all          07/16/2024    8:16 AM  GAD 7 : Generalized Anxiety Score  Nervous,  Anxious, on Edge 0  Control/stop worrying 0  Worry too much - different things 0  Trouble relaxing 0  Restless 0  Easily annoyed or irritable 0  Afraid - awful might happen 0  Total GAD 7 Score 0  Anxiety Difficulty Not difficult at all    IMMUNIZATIONS: - Tdap: Tetanus vaccination status reviewed: last tetanus booster within 10 years. - HPV: Not applicable - Influenza: Up to date - Pneumovax: Recommended - Prevnar 20: Recommended - Shingrix  (50+): Up to date   Past medical history, surgical history, medications, allergies, family history and social history reviewed with patient today and changes made to appropriate areas of the chart.   Past Medical History:  Diagnosis Date   Abnormal cervical Papanicolaou smear 07/16/2024   Anxiety May 2012   I use medication if needed.   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   DMEK & Cataract 12/11/19 & 03/30/20. YAG on 04/18/20 left eye   IRRIGATION AND DEBRIDEMENT HEMATOMA     infected abdominal wall surgery 2008   MANDIBLE SURGERY  08/08/1999    Current Outpatient Medications on File Prior to Visit  Medication Sig   estradiol  (VIVELLE -DOT) 0.05 MG/24HR patch Place 1 patch (0.05 mg total) onto the skin 2 (two) times a week.   LORazepam  (ATIVAN ) 0.5 MG tablet Take 1 tablet (0.5 mg total) by mouth 4 (four) times daily as needed for anxiety.   methocarbamol  (ROBAXIN ) 500 MG tablet Take 1 tablet (500 mg total) by mouth every 6 (six) to 8 (eight) hours as needed for spasms   metroNIDAZOLE  (METROCREAM ) 0.75 % cream Apply topically to face every morning as directed   prednisoLONE  acetate (PRED FORTE ) 1 %  ophthalmic suspension Place 1 drop into both eyes daily.   progesterone  (PROMETRIUM ) 100 MG capsule Take 1 capsule (100 mg total) by mouth daily.   [DISCONTINUED] azelastine  (ASTELIN ) 0.1 % nasal spray Spray 2 sprays twice a day by intranasal route for 7 days.   [DISCONTINUED] ipratropium (ATROVENT ) 0.06 % nasal spray Use 2 sprays in each nostril twice daily as directed   No current facility-administered medications on file prior to visit.    Allergies  Allergen Reactions   Ciprofloxacin Itching   Pyridium [Phenazopyridine] Swelling     Social History   Socioeconomic History   Marital status: Married    Spouse name: Not on file   Number of children: Not on file   Years of education: Not on file   Highest education level: Some college, no degree  Occupational History   Not on file  Tobacco Use   Smoking status: Never   Smokeless tobacco: Never  Vaping Use   Vaping status: Never Used  Substance and Sexual Activity   Alcohol use: Yes   Drug use: No   Sexual activity: Not on file  Other Topics Concern   Not on file  Social History Narrative   Not on file   Social Drivers of Health   Financial Resource Strain: Low Risk  (07/14/2024)   Overall Financial Resource Strain (CARDIA)    Difficulty of Paying Living Expenses: Not hard at all  Food Insecurity: No Food Insecurity (07/14/2024)   Hunger Vital Sign    Worried About Running Out of Food in the Last Year: Never true    Ran Out of Food in the Last Year: Never true  Transportation Needs: No Transportation Needs (07/14/2024)   PRAPARE - Administrator, Civil Service (Medical): No    Lack of Transportation (Non-Medical): No  Physical Activity: Sufficiently Active (07/14/2024)   Exercise Vital Sign    Days of Exercise per Week: 5 days    Minutes of Exercise per Session: 40 min  Stress: No Stress Concern Present (07/14/2024)   [employer] of Occupational Health - Occupational Stress Questionnaire    Feeling  of Stress: Not at all  Social Connections: Socially Integrated (07/14/2024)   Social Connection and Isolation Panel    Frequency of Communication with Friends and Family: More than three times a week    Frequency of Social Gatherings with Friends and Family: More than three times a week    Attends Religious Services: More than 4 times per year    Active Member of [employer] or Organizations: Yes  Attends Engineer, Structural: More than 4 times per year    Marital Status: Married  Catering Manager Violence: Not on file   Social History   Tobacco Use  Smoking Status Never  Smokeless Tobacco Never   Social History   Substance and Sexual Activity  Alcohol Use Yes    Family History  Problem Relation Age of Onset   Diabetes Mother        type ll   Heart disease Father    Alcohol abuse Father    Alcohol abuse Sister    Suicidality Sister      ROS: Denies fever, fatigue, unexplained weight loss/gain, chest pain, SHOB, and palpitations. Denies neurological deficits, gastrointestinal or genitourinary complaints, and skin changes.   Objective:   Today's Vitals   07/16/24 0811  BP: 120/80  Pulse: 67  SpO2: 98%  Weight: 169 lb 14.4 oz (77.1 kg)  Height: 5' 7 (1.702 m)    GENERAL APPEARANCE: Well-appearing, in NAD. Well nourished.  SKIN: Pink, warm and dry. Turgor normal. No rash, lesion, ulceration, or ecchymoses. Hair evenly distributed.  HEENT: HEAD: Normocephalic.  EYES: PERRLA. EOMI. Lids intact w/o defect. Sclera white, Conjunctiva pink w/o exudate.  EARS: External ear w/o redness, swelling, masses or lesions. EAC clear. TM's intact, translucent w/o bulging, appropriate landmarks visualized. Appropriate acuity to conversational tones.  NOSE: Septum midline w/o deformity. Nares patent, mucosa pink and non-inflamed w/o drainage. No sinus tenderness.  THROAT: Uvula midline. Oropharynx clear. Tonsils non-inflamed w/o exudate. Oral mucosa pink and moist.  NECK: Supple,  Trachea midline. Full ROM w/o pain or tenderness. No lymphadenopathy. Thyroid  non-tender w/o enlargement or palpable masses.  RESPIRATORY: Chest wall symmetrical w/o masses. Respirations even and non-labored. Breath sounds clear to auscultation bilaterally. No wheezes, rales, rhonchi, or crackles. CARDIAC: S1, S2 present, regular rate and rhythm. No gallops, murmurs, rubs, or clicks. PMI w/o lifts, heaves, or thrills. No carotid bruits. Capillary refill <2 seconds. Peripheral pulses 2+ bilaterally. GI: Abdomen soft w/o distention. Normoactive bowel sounds. No palpable masses or tenderness. No guarding or rebound tenderness. Liver and spleen w/o tenderness or enlargement. No CVA tenderness.  MSK: Muscle tone and strength appropriate for age, w/o atrophy or abnormal movement.  EXTREMITIES: Active ROM intact, w/o tenderness, crepitus, or contracture. No obvious joint deformities or effusions. No clubbing, edema, or cyanosis.  NEUROLOGIC: CN's II-XII intact. Motor strength symmetrical with no obvious weakness. No sensory deficits. DTR's 2+ symmetric bilaterally. Steady, even gait.  PSYCH/MENTAL STATUS: Alert, oriented x 3. Cooperative, appropriate mood and affect.    Assessment & Plan:  1. Encounter to establish care with new doctor (Primary) Discussed patient's medical, surgical and family history. Reviewed role of Pcp with patient. Pt desired to complete AE with first appt as she had no concerns.   2. Annual physical exam Discussed preventative screenings, vaccines, and healthy lifestyle with patient. Fasting labs completed in office today.   - CBC with Differential/Platelet - Comprehensive metabolic panel with GFR - TSH  3. Screening for lipid disorders - Lipid panel  4. Yeast infection Diflucan  sent in for refill and safe use reviewed with patient.    Orders Placed This Encounter  Procedures   CBC with Differential/Platelet   Comprehensive metabolic panel with GFR   Lipid panel   TSH     PATIENT COUNSELING:  - Encouraged a healthy well-balanced diet. Patient may adjust caloric intake to maintain or achieve ideal body weight. May reduce intake of dietary saturated fat and total fat and  have adequate dietary potassium and calcium preferably from fresh fruits, vegetables, and low-fat dairy products.   - Advised to avoid cigarette smoking. - Discussed with the patient that most people either abstain from alcohol or drink within safe limits (<=14/week and <=4 drinks/occasion for males, <=7/weeks and <= 3 drinks/occasion for females) and that the risk for alcohol disorders and other health effects rises proportionally with the number of drinks per week and how often a drinker exceeds daily limits. - Discussed cessation/primary prevention of drug use and availability of treatment for abuse.  - Discussed sexually transmitted diseases, avoidance of unintended pregnancy and contraceptive alternatives.  - Stressed the importance of regular exercise - Injury prevention: Discussed safety belts, safety helmets, smoke detector, smoking near bedding or upholstery.  - Dental health: Discussed importance of regular tooth brushing, flossing, and dental visits.   NEXT PREVENTATIVE PHYSICAL DUE IN 1 YEAR.  Return in about 1 year (around 07/16/2025) for ANNUAL PHYSICAL.  Patient to reach out to office if new, worrisome, or unresolved symptoms arise or if no improvement in patient's condition. Patient verbalized understanding and is agreeable to treatment plan. All questions answered to patient's satisfaction.    Thersia Schuyler Stark, [place]

## 2024-07-17 ENCOUNTER — Ambulatory Visit (HOSPITAL_BASED_OUTPATIENT_CLINIC_OR_DEPARTMENT_OTHER): Payer: Self-pay | Admitting: Family Medicine

## 2024-07-17 LAB — COMPREHENSIVE METABOLIC PANEL WITH GFR
ALT: 15 IU/L (ref 0–32)
AST: 27 IU/L (ref 0–40)
Albumin: 4.6 g/dL (ref 3.8–4.9)
Alkaline Phosphatase: 85 IU/L (ref 49–135)
BUN/Creatinine Ratio: 24 — ABNORMAL HIGH (ref 9–23)
BUN: 19 mg/dL (ref 6–24)
Bilirubin Total: 0.4 mg/dL (ref 0.0–1.2)
CO2: 23 mmol/L (ref 20–29)
Calcium: 9.8 mg/dL (ref 8.7–10.2)
Chloride: 103 mmol/L (ref 96–106)
Creatinine, Ser: 0.78 mg/dL (ref 0.57–1.00)
Globulin, Total: 2.2 g/dL (ref 1.5–4.5)
Glucose: 87 mg/dL (ref 70–99)
Potassium: 4.8 mmol/L (ref 3.5–5.2)
Sodium: 139 mmol/L (ref 134–144)
Total Protein: 6.8 g/dL (ref 6.0–8.5)
eGFR: 90 mL/min/1.73 (ref >59)

## 2024-07-17 LAB — LIPID PANEL
Chol/HDL Ratio: 3.1 ratio (ref 0.0–4.4)
Cholesterol, Total: 194 mg/dL (ref 100–199)
HDL: 62 mg/dL (ref >39)
LDL Chol Calc (NIH): 116 mg/dL — ABNORMAL HIGH (ref 0–99)
Triglycerides: 89 mg/dL (ref 0–149)
VLDL Cholesterol Cal: 16 mg/dL (ref 5–40)

## 2024-07-17 LAB — CBC WITH DIFFERENTIAL/PLATELET
Basophils Absolute: 0 x10E3/uL (ref 0.0–0.2)
Basos: 0 %
EOS (ABSOLUTE): 0.1 x10E3/uL (ref 0.0–0.4)
Eos: 2 %
Hematocrit: 44.1 % (ref 34.0–46.6)
Hemoglobin: 14.3 g/dL (ref 11.1–15.9)
Immature Grans (Abs): 0 x10E3/uL (ref 0.0–0.1)
Immature Granulocytes: 0 %
Lymphocytes Absolute: 1.7 x10E3/uL (ref 0.7–3.1)
Lymphs: 34 %
MCH: 29.4 pg (ref 26.6–33.0)
MCHC: 32.4 g/dL (ref 31.5–35.7)
MCV: 91 fL (ref 79–97)
Monocytes Absolute: 0.5 x10E3/uL (ref 0.1–0.9)
Monocytes: 10 %
Neutrophils Absolute: 2.7 x10E3/uL (ref 1.4–7.0)
Neutrophils: 54 %
Platelets: 354 x10E3/uL (ref 150–450)
RBC: 4.87 x10E6/uL (ref 3.77–5.28)
RDW: 11.8 % (ref 11.7–15.4)
WBC: 4.9 x10E3/uL (ref 3.4–10.8)

## 2024-07-17 LAB — TSH: TSH: 1.71 u[IU]/mL (ref 0.450–4.500)

## 2024-07-27 ENCOUNTER — Other Ambulatory Visit (HOSPITAL_BASED_OUTPATIENT_CLINIC_OR_DEPARTMENT_OTHER): Payer: Self-pay

## 2024-07-28 ENCOUNTER — Other Ambulatory Visit (HOSPITAL_BASED_OUTPATIENT_CLINIC_OR_DEPARTMENT_OTHER): Payer: Self-pay

## 2024-07-28 MED ORDER — LORAZEPAM 0.5 MG PO TABS
0.5000 mg | ORAL_TABLET | Freq: Four times a day (QID) | ORAL | 2 refills | Status: AC | PRN
  Filled 2024-07-28 (×2): qty 90, 23d supply, fill #0
  Filled 2024-08-18 – 2024-08-19 (×2): qty 90, 23d supply, fill #1
  Filled 2024-08-19: qty 82, 21d supply, fill #1
  Filled 2024-08-19: qty 8, 2d supply, fill #1
  Filled 2024-09-09 – 2024-09-11 (×2): qty 90, 23d supply, fill #2
  Filled ????-??-??: fill #2

## 2024-07-29 ENCOUNTER — Other Ambulatory Visit: Payer: Self-pay

## 2024-07-29 ENCOUNTER — Other Ambulatory Visit (HOSPITAL_BASED_OUTPATIENT_CLINIC_OR_DEPARTMENT_OTHER): Payer: Self-pay

## 2024-08-01 ENCOUNTER — Other Ambulatory Visit (HOSPITAL_BASED_OUTPATIENT_CLINIC_OR_DEPARTMENT_OTHER): Payer: Self-pay

## 2024-08-01 MED ORDER — PREDNISOLONE ACETATE 1 % OP SUSP
1.0000 [drp] | Freq: Every day | OPHTHALMIC | 0 refills | Status: DC
  Filled 2024-08-01: qty 5, 50d supply, fill #0

## 2024-08-18 ENCOUNTER — Other Ambulatory Visit (HOSPITAL_BASED_OUTPATIENT_CLINIC_OR_DEPARTMENT_OTHER): Payer: Self-pay

## 2024-08-19 ENCOUNTER — Other Ambulatory Visit (HOSPITAL_BASED_OUTPATIENT_CLINIC_OR_DEPARTMENT_OTHER): Payer: Self-pay

## 2024-08-19 ENCOUNTER — Encounter (HOSPITAL_BASED_OUTPATIENT_CLINIC_OR_DEPARTMENT_OTHER): Payer: Self-pay

## 2024-08-19 ENCOUNTER — Other Ambulatory Visit: Payer: Self-pay

## 2024-08-19 MED ORDER — PREDNISOLONE ACETATE 1 % OP SUSP
1.0000 [drp] | Freq: Every day | OPHTHALMIC | 0 refills | Status: DC
  Filled 2024-08-19: qty 5, 50d supply, fill #0

## 2024-08-21 ENCOUNTER — Other Ambulatory Visit (HOSPITAL_COMMUNITY): Payer: Self-pay

## 2024-08-22 ENCOUNTER — Other Ambulatory Visit: Payer: Self-pay

## 2024-08-23 ENCOUNTER — Other Ambulatory Visit (HOSPITAL_BASED_OUTPATIENT_CLINIC_OR_DEPARTMENT_OTHER): Payer: Self-pay

## 2024-09-03 ENCOUNTER — Other Ambulatory Visit (HOSPITAL_BASED_OUTPATIENT_CLINIC_OR_DEPARTMENT_OTHER): Payer: Self-pay

## 2024-09-03 MED ORDER — PREDNISOLONE ACETATE 1 % OP SUSP
1.0000 [drp] | Freq: Every day | OPHTHALMIC | 0 refills | Status: AC
  Filled 2024-09-03: qty 5, 50d supply, fill #0

## 2024-09-10 ENCOUNTER — Other Ambulatory Visit: Payer: Self-pay

## 2024-09-10 ENCOUNTER — Other Ambulatory Visit (HOSPITAL_BASED_OUTPATIENT_CLINIC_OR_DEPARTMENT_OTHER): Payer: Self-pay

## 2024-09-11 ENCOUNTER — Other Ambulatory Visit (HOSPITAL_BASED_OUTPATIENT_CLINIC_OR_DEPARTMENT_OTHER): Payer: Self-pay

## 2024-09-11 ENCOUNTER — Other Ambulatory Visit (HOSPITAL_COMMUNITY): Payer: Self-pay

## 2025-07-17 ENCOUNTER — Encounter (HOSPITAL_BASED_OUTPATIENT_CLINIC_OR_DEPARTMENT_OTHER): Admitting: Family Medicine
